# Patient Record
Sex: Female | Born: 1980 | Race: White | Hispanic: No | Marital: Married | Smoking: Never smoker
Health system: Southern US, Community
[De-identification: ages and names within clinical notes are randomized; demographics above are authoritative.]

## PROBLEM LIST (undated history)

## (undated) ENCOUNTER — Inpatient Hospital Stay (HOSPITAL_COMMUNITY): Payer: Self-pay

## (undated) DIAGNOSIS — F419 Anxiety disorder, unspecified: Secondary | ICD-10-CM

## (undated) DIAGNOSIS — R031 Nonspecific low blood-pressure reading: Secondary | ICD-10-CM

## (undated) DIAGNOSIS — D649 Anemia, unspecified: Secondary | ICD-10-CM

## (undated) DIAGNOSIS — Z8489 Family history of other specified conditions: Secondary | ICD-10-CM

## (undated) DIAGNOSIS — N96 Recurrent pregnancy loss: Secondary | ICD-10-CM

## (undated) DIAGNOSIS — T7840XA Allergy, unspecified, initial encounter: Secondary | ICD-10-CM

## (undated) DIAGNOSIS — Z8619 Personal history of other infectious and parasitic diseases: Secondary | ICD-10-CM

## (undated) DIAGNOSIS — D689 Coagulation defect, unspecified: Secondary | ICD-10-CM

## (undated) DIAGNOSIS — K589 Irritable bowel syndrome without diarrhea: Secondary | ICD-10-CM

## (undated) DIAGNOSIS — O09529 Supervision of elderly multigravida, unspecified trimester: Secondary | ICD-10-CM

## (undated) DIAGNOSIS — Z1589 Genetic susceptibility to other disease: Secondary | ICD-10-CM

## (undated) DIAGNOSIS — K219 Gastro-esophageal reflux disease without esophagitis: Secondary | ICD-10-CM

## (undated) HISTORY — PX: WISDOM TOOTH EXTRACTION: SHX21

## (undated) HISTORY — DX: Coagulation defect, unspecified: D68.9

## (undated) HISTORY — PX: HYMENECTOMY: SHX987

## (undated) HISTORY — DX: Allergy, unspecified, initial encounter: T78.40XA

## (undated) HISTORY — DX: Irritable bowel syndrome, unspecified: K58.9

## (undated) HISTORY — DX: Anxiety disorder, unspecified: F41.9

## (undated) HISTORY — DX: Genetic susceptibility to other disease: Z15.89

## (undated) HISTORY — DX: Personal history of other infectious and parasitic diseases: Z86.19

## (undated) HISTORY — PX: LAPAROSCOPY: SHX197

## (undated) HISTORY — DX: Supervision of elderly multigravida, unspecified trimester: O09.529

## (undated) HISTORY — PX: DILATION AND EVACUATION: SHX1459

---

## 1999-10-15 ENCOUNTER — Other Ambulatory Visit: Admission: RE | Admit: 1999-10-15 | Discharge: 1999-10-15 | Payer: Self-pay | Admitting: Obstetrics and Gynecology

## 2002-07-19 ENCOUNTER — Other Ambulatory Visit: Admission: RE | Admit: 2002-07-19 | Discharge: 2002-07-19 | Payer: Self-pay | Admitting: Obstetrics & Gynecology

## 2002-09-21 ENCOUNTER — Ambulatory Visit (HOSPITAL_COMMUNITY): Admission: RE | Admit: 2002-09-21 | Discharge: 2002-09-21 | Payer: Self-pay | Admitting: Obstetrics and Gynecology

## 2002-09-21 ENCOUNTER — Encounter (INDEPENDENT_AMBULATORY_CARE_PROVIDER_SITE_OTHER): Payer: Self-pay

## 2002-10-18 ENCOUNTER — Emergency Department (HOSPITAL_COMMUNITY): Admission: EM | Admit: 2002-10-18 | Discharge: 2002-10-18 | Payer: Self-pay | Admitting: Emergency Medicine

## 2010-02-12 ENCOUNTER — Ambulatory Visit (HOSPITAL_COMMUNITY)
Admission: RE | Admit: 2010-02-12 | Discharge: 2010-02-12 | Payer: Self-pay | Source: Home / Self Care | Attending: Obstetrics & Gynecology | Admitting: Obstetrics & Gynecology

## 2010-03-14 ENCOUNTER — Ambulatory Visit (HOSPITAL_COMMUNITY)
Admission: RE | Admit: 2010-03-14 | Discharge: 2010-03-14 | Payer: Self-pay | Source: Home / Self Care | Attending: Gynecology | Admitting: Gynecology

## 2010-07-19 NOTE — Op Note (Signed)
   NAME:  Cheryl Stein, Cheryl Stein                          ACCOUNT NO.:  [id number]   MEDICAL RECORD NO.:  [id number]                   PATIENT TYPE:  AMB   LOCATION:  [facility]C                                  FACILITY:  [facility]   PHYSICIAN:  Sherry A. Rosalio Macadamia, M.D.           DATE OF BIRTH:  10/14/1980   DATE OF PROCEDURE:  09/21/2002  DATE OF DISCHARGE:                                 OPERATIVE REPORT   PREOPERATIVE DIAGNOSIS:  Imperforate hymen.   POSTOPERATIVE DIAGNOSES:  1. Imperforate hymen.  2. Vulvar mole.   PROCEDURES:  1. Hymenectomy.  2. Vulvar mole excision.  3. Examination under anesthesia.   SURGEON:  Sherry A. Rosalio Macadamia, M.D.   ANESTHESIA:  General.   INDICATIONS:  This is a 29 year old G0, P0, woman, who has never been able  to use tampons and pelvic exam was not able to be performed because of a  partial imperforate hymen.  Because of that the patient is brought to the  operating room for excision.   FINDINGS:  Partial imperforate hymen, right vulvar mole.  Normal anteflexed  uterus.  No adnexal mass.   PROCEDURE:  The patient was brought into the operating room and given  adequate general anesthesia.  She was placed in dorsal lithotomy position.  Her perineum was washed with Hibiclens.  Inside of the vagina was washed  with Hibiclens with Q-Tips.  The hymen was infiltrated with 0.25% Marcaine.  Using hemostats placed x2, tissue between was cut, 4-0 chromic stitches were  taken.  This was done circumferentially around the hymenal tissues until two  fingers could easily be inserted into the vagina.  Adequate hemostasis was  present.  A small right vulvar nodule was seen, and this was excised as a  shave biopsy.  There was no significant bleeding present.  Exam under  anesthesia was then performed, the surgeon's gloves were changed, and the  bladder was in-and-out catheterized.  The patient was taken out of the  dorsal lithotomy position.  She was awakened, she was  extubated.  She was  moved from the operating table to a stretcher in stable condition.   COMPLICATIONS:  None.    ESTIMATED BLOOD LOSS:  Less than 5 mL.   OPERATIVE SPECIMENS:  Right vulvar mole.                                               Sherry A. Rosalio Macadamia, M.D.    SAD/MEDQ  D:  09/21/2002  T:  09/21/2002  Job:  [id number]

## 2010-10-02 ENCOUNTER — Inpatient Hospital Stay (HOSPITAL_COMMUNITY): Admission: AD | Admit: 2010-10-02 | Payer: Self-pay | Source: Ambulatory Visit | Admitting: Obstetrics and Gynecology

## 2010-12-21 ENCOUNTER — Inpatient Hospital Stay (HOSPITAL_COMMUNITY)
Admission: AD | Admit: 2010-12-21 | Discharge: 2010-12-24 | DRG: 371 | Disposition: A | Discharge status: Home / Self Care | Payer: BC Managed Care – PPO | Source: Ambulatory Visit | Attending: Obstetrics and Gynecology | Admitting: Obstetrics and Gynecology

## 2010-12-21 ENCOUNTER — Other Ambulatory Visit: Payer: Self-pay | Admitting: Obstetrics and Gynecology

## 2010-12-21 ENCOUNTER — Inpatient Hospital Stay (HOSPITAL_COMMUNITY): Payer: BC Managed Care – PPO | Admitting: Anesthesiology

## 2010-12-21 ENCOUNTER — Encounter (HOSPITAL_COMMUNITY)
Admission: AD | Disposition: A | Discharge status: Home / Self Care | Payer: Self-pay | Source: Ambulatory Visit | Attending: Obstetrics and Gynecology

## 2010-12-21 ENCOUNTER — Encounter (HOSPITAL_COMMUNITY): Payer: Self-pay

## 2010-12-21 ENCOUNTER — Inpatient Hospital Stay (HOSPITAL_COMMUNITY): Payer: BC Managed Care – PPO

## 2010-12-21 ENCOUNTER — Encounter (HOSPITAL_COMMUNITY): Payer: Self-pay | Admitting: Anesthesiology

## 2010-12-21 DIAGNOSIS — O368990 Maternal care for other specified fetal problems, unspecified trimester, not applicable or unspecified: Secondary | ICD-10-CM

## 2010-12-21 DIAGNOSIS — O368190 Decreased fetal movements, unspecified trimester, not applicable or unspecified: Secondary | ICD-10-CM | POA: Diagnosis present

## 2010-12-21 HISTORY — DX: Anemia, unspecified: D64.9

## 2010-12-21 LAB — COMPREHENSIVE METABOLIC PANEL
Alkaline Phosphatase: 141 U/L — ABNORMAL HIGH (ref 39–117)
BUN: 10 mg/dL (ref 6–23)
CO2: 23 mEq/L (ref 19–32)
Chloride: 102 mEq/L (ref 96–112)
GFR calc Af Amer: >90 mL/min (ref >90)
Glucose, Bld: 78 mg/dL (ref 70–99)
Potassium: 3.7 mEq/L (ref 3.5–5.1)
Total Bilirubin: 0.2 mg/dL — ABNORMAL LOW (ref 0.3–1.2)

## 2010-12-21 LAB — RPR: RPR Ser Ql: NONREACTIVE

## 2010-12-21 LAB — CBC
HCT: 34.8 % — ABNORMAL LOW (ref 36.0–46.0)
HCT: 30.2 % — ABNORMAL LOW (ref 36.0–46.0)
Hemoglobin: 9.9 g/dL — ABNORMAL LOW (ref 12.0–15.0)
MCH: 27.2 pg (ref 26.0–34.0)
MCHC: 32.5 g/dL (ref 30.0–36.0)
MCHC: 32.8 g/dL (ref 30.0–36.0)
Platelets: 193 10*3/uL (ref 150–400)
RDW: 13 % (ref 11.5–15.5)
WBC: 10 10*3/uL (ref 4.0–10.5)

## 2010-12-21 LAB — URINALYSIS, ROUTINE W REFLEX MICROSCOPIC
Bilirubin Urine: NEGATIVE
Hgb urine dipstick: NEGATIVE
Nitrite: NEGATIVE
Specific Gravity, Urine: 1.015 (ref 1.005–1.030)

## 2010-12-21 LAB — URINE MICROSCOPIC-ADD ON

## 2010-12-21 LAB — URIC ACID: Uric Acid, Serum: 4.6 mg/dL (ref 2.4–7.0)

## 2010-12-21 SURGERY — Surgical Case
Anesthesia: Regional | Wound class: Clean Contaminated

## 2010-12-21 MED ORDER — LACTATED RINGERS IV SOLN
INTRAVENOUS | Status: DC
  Administered 2010-12-21 – 2010-12-22 (×2): via INTRAVENOUS

## 2010-12-21 MED ORDER — SODIUM CHLORIDE 0.9 % IV SOLN: 1.0000 ug/kg/h | INTRAVENOUS | Status: DC | PRN

## 2010-12-21 MED ORDER — ONDANSETRON HCL 4 MG/2ML IJ SOLN
INTRAMUSCULAR | Status: DC | PRN
  Administered 2010-12-21: 4 mg via INTRAVENOUS

## 2010-12-21 MED ORDER — OXYCODONE-ACETAMINOPHEN 5-325 MG PO TABS
1.0000 | ORAL_TABLET | ORAL | Status: DC | PRN
  Administered 2010-12-22 – 2010-12-23 (×3): 1 via ORAL
  Filled 2010-12-21 (×3): qty 1

## 2010-12-21 MED ORDER — OXYTOCIN 20 UNITS IN LACTATED RINGERS INFUSION - SIMPLE
INTRAVENOUS | Status: DC | PRN
  Administered 2010-12-21: 10 [IU] via INTRAVENOUS
  Administered 2010-12-21 (×2): 20 [IU] via INTRAVENOUS

## 2010-12-21 MED ORDER — FAMOTIDINE IN NACL 20-0.9 MG/50ML-% IV SOLN
INTRAVENOUS | Status: AC
  Administered 2010-12-21: 20 mg
  Filled 2010-12-21: qty 50

## 2010-12-21 MED ORDER — CITRIC ACID-SODIUM CITRATE 334-500 MG/5ML PO SOLN
30.0000 mL | Freq: Once | ORAL | Status: AC
  Administered 2010-12-21: 30 mL via ORAL

## 2010-12-21 MED ORDER — WITCH HAZEL-GLYCERIN EX PADS: 1.0000 "application " | MEDICATED_PAD | CUTANEOUS | Status: DC | PRN

## 2010-12-21 MED ORDER — NALOXONE HCL 0.4 MG/ML IJ SOLN: 0.4000 mg | INTRAMUSCULAR | Status: DC | PRN

## 2010-12-21 MED ORDER — MEPERIDINE HCL 25 MG/ML IJ SOLN: 6.2500 mg | INTRAMUSCULAR | Status: DC | PRN

## 2010-12-21 MED ORDER — SCOPOLAMINE 1 MG/3DAYS TD PT72
1.0000 | MEDICATED_PATCH | Freq: Once | TRANSDERMAL | Status: DC
  Administered 2010-12-21: 1.5 mg via TRANSDERMAL

## 2010-12-21 MED ORDER — CITRIC ACID-SODIUM CITRATE 334-500 MG/5ML PO SOLN
ORAL | Status: AC
  Administered 2010-12-21: 30 mL via ORAL
  Filled 2010-12-21: qty 15

## 2010-12-21 MED ORDER — MENTHOL 3 MG MT LOZG: 1.0000 | LOZENGE | OROMUCOSAL | Status: DC | PRN

## 2010-12-21 MED ORDER — TETANUS-DIPHTH-ACELL PERTUSSIS 5-2.5-18.5 LF-MCG/0.5 IM SUSP: 0.5000 mL | Freq: Once | INTRAMUSCULAR | Status: DC

## 2010-12-21 MED ORDER — ONDANSETRON HCL 4 MG PO TABS: 4.0000 mg | ORAL_TABLET | ORAL | Status: DC | PRN

## 2010-12-21 MED ORDER — PRENATAL PLUS 27-1 MG PO TABS
1.0000 | ORAL_TABLET | Freq: Every day | ORAL | Status: DC
  Administered 2010-12-22 – 2010-12-24 (×2): 1 via ORAL
  Filled 2010-12-21 (×3): qty 1

## 2010-12-21 MED ORDER — SODIUM CHLORIDE 0.9 % IJ SOLN: 3.0000 mL | INTRAMUSCULAR | Status: DC | PRN

## 2010-12-21 MED ORDER — DIPHENHYDRAMINE HCL 50 MG/ML IJ SOLN: 12.5000 mg | INTRAMUSCULAR | Status: DC | PRN

## 2010-12-21 MED ORDER — OXYTOCIN 20 UNITS IN LACTATED RINGERS INFUSION - SIMPLE: 125.0000 mL/h | INTRAVENOUS | Status: AC

## 2010-12-21 MED ORDER — NALBUPHINE HCL 10 MG/ML IJ SOLN: 5.0000 mg | INTRAMUSCULAR | Status: DC | PRN

## 2010-12-21 MED ORDER — SENNOSIDES-DOCUSATE SODIUM 8.6-50 MG PO TABS
2.0000 | ORAL_TABLET | Freq: Every day | ORAL | Status: DC
  Administered 2010-12-21 – 2010-12-23 (×3): 2 via ORAL

## 2010-12-21 MED ORDER — SCOPOLAMINE 1 MG/3DAYS TD PT72
MEDICATED_PATCH | TRANSDERMAL | Status: AC
  Filled 2010-12-21: qty 1

## 2010-12-21 MED ORDER — LACTATED RINGERS IV SOLN
INTRAVENOUS | Status: DC | PRN
  Administered 2010-12-21 (×3): via INTRAVENOUS

## 2010-12-21 MED ORDER — ONDANSETRON HCL 4 MG/2ML IJ SOLN: 4.0000 mg | Freq: Three times a day (TID) | INTRAMUSCULAR | Status: DC | PRN

## 2010-12-21 MED ORDER — SIMETHICONE 80 MG PO CHEW: 80.0000 mg | CHEWABLE_TABLET | ORAL | Status: DC | PRN

## 2010-12-21 MED ORDER — IBUPROFEN 600 MG PO TABS
600.0000 mg | ORAL_TABLET | Freq: Four times a day (QID) | ORAL | Status: DC | PRN
  Administered 2010-12-22: 600 mg via ORAL
  Filled 2010-12-21 (×7): qty 1

## 2010-12-21 MED ORDER — LANOLIN HYDROUS EX OINT: 1.0000 "application " | TOPICAL_OINTMENT | CUTANEOUS | Status: DC | PRN

## 2010-12-21 MED ORDER — KETOROLAC TROMETHAMINE 60 MG/2ML IM SOLN
INTRAMUSCULAR | Status: AC
  Administered 2010-12-21: 60 mg via INTRAMUSCULAR
  Filled 2010-12-21: qty 2

## 2010-12-21 MED ORDER — ZOLPIDEM TARTRATE 5 MG PO TABS: 5.0000 mg | ORAL_TABLET | Freq: Every evening | ORAL | Status: DC | PRN

## 2010-12-21 MED ORDER — DIBUCAINE 1 % RE OINT: 1.0000 "application " | TOPICAL_OINTMENT | RECTAL | Status: DC | PRN

## 2010-12-21 MED ORDER — KETOROLAC TROMETHAMINE 60 MG/2ML IM SOLN
60.0000 mg | Freq: Once | INTRAMUSCULAR | Status: AC | PRN
  Administered 2010-12-21: 60 mg via INTRAMUSCULAR

## 2010-12-21 MED ORDER — IBUPROFEN 600 MG PO TABS
600.0000 mg | ORAL_TABLET | Freq: Four times a day (QID) | ORAL | Status: DC
  Administered 2010-12-21 – 2010-12-24 (×9): 600 mg via ORAL
  Filled 2010-12-21 (×2): qty 1

## 2010-12-21 MED ORDER — KETOROLAC TROMETHAMINE 30 MG/ML IJ SOLN: 30.0000 mg | Freq: Four times a day (QID) | INTRAMUSCULAR | Status: AC | PRN

## 2010-12-21 MED ORDER — ONDANSETRON HCL 4 MG/2ML IJ SOLN: 4.0000 mg | INTRAMUSCULAR | Status: DC | PRN

## 2010-12-21 MED ORDER — METOCLOPRAMIDE HCL 5 MG/ML IJ SOLN: 10.0000 mg | Freq: Three times a day (TID) | INTRAMUSCULAR | Status: DC | PRN

## 2010-12-21 MED ORDER — DIPHENHYDRAMINE HCL 50 MG/ML IJ SOLN: 25.0000 mg | INTRAMUSCULAR | Status: DC | PRN

## 2010-12-21 MED ORDER — CEFAZOLIN SODIUM 1-5 GM-% IV SOLN
1.0000 g | INTRAVENOUS | Status: AC
  Administered 2010-12-21: 1 g via INTRAVENOUS
  Filled 2010-12-21: qty 50

## 2010-12-21 MED ORDER — FAMOTIDINE IN NACL 20-0.9 MG/50ML-% IV SOLN: 20.0000 mg | Freq: Once | INTRAVENOUS | Status: DC

## 2010-12-21 MED ORDER — SIMETHICONE 80 MG PO CHEW
80.0000 mg | CHEWABLE_TABLET | Freq: Three times a day (TID) | ORAL | Status: DC
  Administered 2010-12-21 – 2010-12-24 (×6): 80 mg via ORAL

## 2010-12-21 MED ORDER — DIPHENHYDRAMINE HCL 25 MG PO CAPS: 25.0000 mg | ORAL_CAPSULE | ORAL | Status: DC | PRN

## 2010-12-21 MED ORDER — DIPHENHYDRAMINE HCL 25 MG PO CAPS: 25.0000 mg | ORAL_CAPSULE | Freq: Four times a day (QID) | ORAL | Status: DC | PRN

## 2010-12-21 SURGICAL SUPPLY — 29 items
CLOTH BEACON ORANGE TIMEOUT ST (SAFETY) ×2 IMPLANT
DRESSING TELFA 8X3 (GAUZE/BANDAGES/DRESSINGS) IMPLANT
DRSG COVADERM 4X8 (GAUZE/BANDAGES/DRESSINGS) ×2 IMPLANT
ELECT REM PT RETURN 9FT ADLT (ELECTROSURGICAL) ×2
ELECTRODE REM PT RTRN 9FT ADLT (ELECTROSURGICAL) ×1 IMPLANT
EXTRACTOR VACUUM M CUP 4 TUBE (SUCTIONS) IMPLANT
GAUZE SPONGE 4X4 12PLY STRL LF (GAUZE/BANDAGES/DRESSINGS) IMPLANT
GLOVE BIO SURGEON STRL SZ8 (GLOVE) ×2 IMPLANT
GLOVE SS BIOGEL STRL SZ 8 (GLOVE) ×2 IMPLANT
GLOVE SUPERSENSE BIOGEL SZ 8 (GLOVE) ×2
GLOVE SURG ORTHO 8.0 STRL STRW (GLOVE) IMPLANT
GOWN PREVENTION PLUS LG XLONG (DISPOSABLE) ×4 IMPLANT
GOWN STRL REIN XL XLG (GOWN DISPOSABLE) ×2 IMPLANT
KIT ABG SYR 3ML LUER SLIP (SYRINGE) ×2 IMPLANT
NEEDLE HYPO 25X5/8 SAFETYGLIDE (NEEDLE) ×2 IMPLANT
NS IRRIG 1000ML POUR BTL (IV SOLUTION) ×2 IMPLANT
PACK C SECTION WH (CUSTOM PROCEDURE TRAY) ×2 IMPLANT
PAD ABD 7.5X8 STRL (GAUZE/BANDAGES/DRESSINGS) IMPLANT
SLEEVE SCD COMPRESS KNEE MED (MISCELLANEOUS) ×2 IMPLANT
STAPLER VISISTAT 35W (STAPLE) IMPLANT
SUT MNCRL 0 VIOLET CTX 36 (SUTURE) ×3 IMPLANT
SUT MONOCRYL 0 CTX 36 (SUTURE) ×3
SUT PDS AB 1 CT  36 (SUTURE)
SUT PDS AB 1 CT 36 (SUTURE) IMPLANT
SUT VIC AB 1 CTX 36 (SUTURE) ×1
SUT VIC AB 1 CTX36XBRD ANBCTRL (SUTURE) ×1 IMPLANT
TOWEL OR 17X24 6PK STRL BLUE (TOWEL DISPOSABLE) ×4 IMPLANT
TRAY FOLEY CATH 14FR (SET/KITS/TRAYS/PACK) ×2 IMPLANT
WATER STERILE IRR 1000ML POUR (IV SOLUTION) ×2 IMPLANT

## 2010-12-21 NOTE — Progress Notes (Signed)
Decreased fetal movement today has tried drinking eating nothing has helped.

## 2010-12-21 NOTE — Anesthesia Preprocedure Evaluation (Signed)
Anesthesia Evaluation  Name, MR# and DOB Patient awake  General Assessment Comment  Reviewed: Allergy & Precautions, H&P , Patient's Chart, lab work & pertinent test results  Airway Mallampati: II TM Distance: >3 FB Neck ROM: full    Dental No notable dental hx.    Pulmonary  clear to auscultation  Pulmonary exam normal       Cardiovascular Exercise Tolerance: Good regular Normal    Neuro/Psych    GI/Hepatic   Endo/Other    Renal/GU      Musculoskeletal   Abdominal   Peds  Hematology   Anesthesia Other Findings   Reproductive/Obstetrics                           Anesthesia Physical Anesthesia Plan  ASA: II  Anesthesia Plan: Spinal   Post-op Pain Management:    Induction:   Airway Management Planned:   Additional Equipment:   Intra-op Plan:   Post-operative Plan:   Informed Consent: I have reviewed the patients History and Physical, chart, labs and discussed the procedure including the risks, benefits and alternatives for the proposed anesthesia with the patient or authorized representative who has indicated his/her understanding and acceptance.   Dental Advisory Given  Plan Discussed with: CRNA  Anesthesia Plan Comments: (Lab work confirmed with CRNA in room. Platelets okay. Discussed spinal anesthetic, and patient consents to the procedure:  included risk of possible headache,backache, failed block, allergic reaction, and nerve injury. This patient was asked if she had any questions or concerns before the procedure started. )        Anesthesia Quick Evaluation  

## 2010-12-21 NOTE — Op Note (Signed)
Dictation [phone] [id number]

## 2010-12-21 NOTE — Consult Note (Signed)
Neonatology Note:   Attendance at C-section:    I was asked to attend this primary C/S at 37 3/7 weeks due to decreased fetal movement, BPP of 2/8, and NRFHR. The mother is a G1P0. ROM at delivery, fluid with thin meconium. Infant vigorous with good spontaneous cry and tone. Needed only bulb suctioning, got thin green secretions several times. Ap 9/9. Lungs clear to ausc in DR. To CN to care of Pediatrician.   Deatra James, MD

## 2010-12-21 NOTE — Transfer of Care (Signed)
Immediate Anesthesia Transfer of Care Note  Patient: Cheryl Stein  Procedure(s) Performed:  CESAREAN SECTION - Primary Cesarean Section with birth of baby boy @ 1509  Patient Location: PACU  Anesthesia Type: Spinal  Level of Consciousness: awake, alert  and oriented  Airway & Oxygen Therapy: Patient Spontanous Breathing  Post-op Assessment: Report given to PACU RN and Post -op Vital signs reviewed and stable  Post vital signs: Reviewed and stable  Complications: No apparent anesthesia complications

## 2010-12-21 NOTE — Op Note (Signed)
Cheryl Stein, Cheryl Stein           ACCOUNT NO.:  [id number]  MEDICAL RECORD NO.:  [id number]  LOCATION:  [facility]                          FACILITY:  [facility]  PHYSICIAN:  Dineen Kid. Rana Snare, M.D.    DATE OF BIRTH:  [birth year]  DATE OF PROCEDURE:  12/21/2010 DATE OF DISCHARGE:                              OPERATIVE REPORT   PREOPERATIVE DIAGNOSIS:  Intrauterine pregnancy at 48 and 3/7 weeks, fetal distress, decreased fetal movement.  POSTOPERATIVE DIAGNOSIS:  Intrauterine pregnancy at 37 and 3/7 weeks, fetal distress, decreased fetal movement.  PROCEDURE:  Primary low segment transverse cesarean section.  SURGEON:  Dineen Kid. Rana Snare, MD  ANESTHESIA:  Spinal.  INDICATIONS:  Cheryl Stein is a 30 year old, G1, who presented to the hospital for decreased fetal movement  today. She reports that the baby is normally very active and she could not even move today despite giving fluids, rest, caffeine, presented to the hospital, had a very flat fetal heart rate tracing, underwent biophysical profile, which showed a biophysical of 2/8.  The only issue for fluid which was also on the low side as well.  Placed back on the fetal monitor and was having contractions every 10-15 minutes, each1 followed by a late deceleration and bladder reactivity because of fetal distress and nonreassuring biophysical profile.  We will proceed with an urgent cesarean section. Risks and benefits were discussed at length.  Informed consent was obtained.  FINDINGS AT TIME OF SURGERY:  Viable female infant, Apgars were 9 and 9. PH arterial 7.31.  Fluid was clear with greenish tint. The placenta appeared to be normal except for marginal insertion of the placenta.  DESCRIPTION OF PROCEDURE:  After adequate analgesia, the patient was placed in the supine position left lateral tilt.  She was sterilely prepped and draped.  Bladder sterilely drained with Foley catheter.  A Pfannenstiel skin incision was made 2 fingerbreadths above  the pubic symphysis, taken down sharply to the fascia which was incised transversely, extended superiorly and inferiorly off the bellies rectus muscles were separated sharply in the midline.  Peritoneum entered sharply.  Bladder flap created, placed the bladder blade.  Low segment myotomy incision was made down to the amniotic sac.  Amniotomy was performed.  Infant was vertex, delivered atraumatically, the nares and pharynx suctioned, infant delivered, cord clamped and cut, handed to pediatrician for resuscitation.  Cord blood was then obtained.  Placenta extracted manually.  Uterus exteriorized, wiped clean with a dry lap. The myotomy incision closed in 2 layers, 1st being running locking, 2nd being imbricating layer of 0 Monocryl suture.  The right side of the myotomy incision had small amount of bleeding near the edge of the bladder, figure-of-eight of 0 Monocryl suture was used to achieve good hemostasis and a small area of the dome of the bladder was oversewn with a 2-0 chromic due to potential weakening of the areas on the bladder. The bladder was then instilled with sterile milk and was 3-400 mL without any evidence leaking.  The bladder was then drained.  Uterus placed back into the abdominal cavity.  After copious irrigation and adequate hemostasis was assured, the peritoneum was closed with 0 Monocryl suture.  Rectus muscle plicated in midline.  Irrigation applied after adequate hemostasis, the fascia closed with 0 Vicryl in a running fashion.  Irrigation applied after adequate hemostasis, skin stapled, Steri-Strips applied.  The patient tolerated procedure well and was stable on transfer to recovery room.  Sponge and instrument was normal x3.  ESTIMATED BLOOD LOSS:  700 mL.  The patient received 1 g of Ancef preoperatively.     Dineen Kid Rana Snare, M.D.     DCL/MEDQ  D:  12/21/2010  T:  12/21/2010  Job:  [id number]

## 2010-12-21 NOTE — ED Provider Notes (Signed)
I have reviewed these findings and discussed the plan of care with the patient.  For fetal distress now with late decels and BPP 8/8 plan primary Cesarean section.  The risks and benefits were discussed and informed consent obtained.  NICU and Anesthesia notified. DL

## 2010-12-21 NOTE — Anesthesia Postprocedure Evaluation (Signed)
  Anesthesia Post-op Note  Patient: Cheryl Stein  Procedure(s) Performed:  CESAREAN SECTION - Primary Cesarean Section with birth of baby boy @ 1509  Patient is awake, responsive, moving her legs, and has signs of resolution of her numbness. Pain and nausea are reasonably well controlled. Vital signs are stable and clinically acceptable. Oxygen saturation is clinically acceptable. There are no apparent anesthetic complications at this time. Patient is ready for discharge.

## 2010-12-21 NOTE — Progress Notes (Signed)
Pt presents to MAU with complaints of decreased fetal movement.

## 2010-12-21 NOTE — ED Notes (Signed)
Pt back from [place]. Verbal report was 2/8 for BPP, Laurell Josephs charge RN called Dr. Rana Snare, Dr. Christy Gentles CNM notified.

## 2010-12-21 NOTE — ED Notes (Signed)
Pt up to bathroom, lab at bedside for blood work. Following lab draw patient will go to [place]. [place] ready for patient now.

## 2010-12-21 NOTE — ED Provider Notes (Signed)
Reina Fuse y.o.G1P0000 @[redacted]w[redacted]d  Chief Complaint  Patient presents with  . Decreased Fetal Movement    SUBJECTIVE  HPI: Presents with history of perceiving only 2 slight fetal movements all day today. Her fetus is usually very active during the day. She is unaware of any contractions. Denies abdominal discomfort, leakage of fluid, vaginal bleeding. Denies headache, visual disturbance, epigastric pain.  Pregnancy course: Significant for a marginal cord insertion. States glucola and BP nl at PN visits.  Past Medical History  Diagnosis Date  . Anemia   . Endometriosis    Ob Hx: Gyn Hx: Past Surgical History  Procedure Date  . Laparoscopy    History   Social History  . Marital Status: Married    Spouse Name: N/A    Number of Children: N/A  . Years of Education: N/A   Occupational History  . Not on file.   Social History Main Topics  . Smoking status: Never Smoker   . Smokeless tobacco: Not on file  . Alcohol Use: No  . Drug Use: No  . Sexually Active:    Other Topics Concern  . Not on file   Social History Narrative  . No narrative on file   No current facility-administered medications on file prior to encounter.   No current outpatient prescriptions on file prior to encounter.   Not on File  ROS: Pertinent items in HPI  OBJECTIVE  BP 127/87  Pulse 83  Temp(Src) 98.5 F (36.9 C) (Oral)  Resp 16  Ht 5' 7.5" (1.715 m)  Wt 79.198 kg (174 lb 9.6 oz)  BMI 26.94 kg/m2  Rpt BP 140/88  Physical Exam:  General: WN/WD in NAD Abd: soft nontender. EFW 5#, FH 33cm Pelvic: cx post 1/ 2.5 cm/-2 vtx Back:neg CVAT Ext:1+ edema, 2+ DTRs Results for orders placed during the hospital encounter of 12/21/10 (from the past 24 hour(s))  URINALYSIS, ROUTINE W REFLEX MICROSCOPIC     Status: Abnormal   Collection Time   12/21/10  1:15 PM      Component Value Range   Color, Urine YELLOW  YELLOW    Appearance CLEAR  CLEAR    Specific Gravity, Urine 1.015  1.005 -  1.030    pH 6.0  5.0 - 8.0    Glucose, UA NEGATIVE  NEGATIVE (mg/dL)   Hgb urine dipstick NEGATIVE  NEGATIVE    Bilirubin Urine NEGATIVE  NEGATIVE    Ketones, ur NEGATIVE  NEGATIVE (mg/dL)   Protein, ur NEGATIVE  NEGATIVE (mg/dL)   Urobilinogen, UA 0.2  0.0 - 1.0 (mg/dL)   Nitrite NEGATIVE  NEGATIVE    Leukocytes, UA MODERATE (*) NEGATIVE   URINE MICROSCOPIC-ADD ON     Status: Abnormal   Collection Time   12/21/10  1:15 PM      Component Value Range   Squamous Epithelial / LPF FEW (*) RARE    WBC, UA 7-10  <3 (WBC/hpf)   RBC / HPF 3-6  <3 (RBC/hpf)   Bacteria, UA FEW (*) RARE   CBC     Status: Abnormal   Collection Time   12/21/10  1:16 PM      Component Value Range   WBC 10.0  4.0 - 10.5 (K/uL)   RBC 4.15  3.87 - 5.11 (MIL/uL)   Hemoglobin 11.3 (*) 12.0 - 15.0 (g/dL)   HCT 11.9 (*) 14.7 - 46.0 (%)   MCV 83.9  78.0 - 100.0 (fL)   MCH 27.2  26.0 - 34.0 (pg)  MCHC 32.5  30.0 - 36.0 (g/dL)   RDW 45.4  09.8 - 11.9 (%)   Platelets 193  150 - 400 (K/uL)  URIC ACID     Status: Normal   Collection Time   12/21/10  1:16 PM      Component Value Range   Uric Acid, Serum 4.6  2.4 - 7.0 (mg/dL)   BPP 2/8  Toco: occ UC FHR 160-165 baseline, NR, mod variability, variable  To 110 with initial UC, retrun to 165 baselline ASSESSMENT  G1 at 37.4 wks with NRFHR and BPP Borderline BP elevations Marginal cord insertion Small for dates by exam  PLAN NPO, O2 by mask, IVF  C/W Dr. Rana Snare

## 2010-12-21 NOTE — Anesthesia Procedure Notes (Addendum)
Spinal Block  Patient location during procedure: OR Preanesthetic Checklist Completed: patient identified, site marked, surgical consent, pre-op evaluation, timeout performed, IV checked, risks and benefits discussed and monitors and equipment checked Spinal Block Patient position: sitting Prep: DuraPrep Patient monitoring: heart rate, cardiac monitor, continuous pulse ox and blood pressure Approach: midline Location: L3-4 Injection technique: single-shot Needle Needle type: Sprotte  Needle gauge: 24 G Needle length: 9 cm Assessment Sensory level: T4 Additional Notes Spinal Dosage in OR  Bupivicaine ml       1.7 PFMS04   mcg        150 Fentanyl mcg            20

## 2010-12-22 LAB — CBC
MCH: 27.6 pg (ref 26.0–34.0)
Platelets: 164 10*3/uL (ref 150–400)
RBC: 3.15 MIL/uL — ABNORMAL LOW (ref 3.87–5.11)

## 2010-12-22 NOTE — Progress Notes (Addendum)
Subjective: Postpartum Day 1: Cesarean Delivery Patient reports tolerating PO.    Objective:   Physical Exam:  General: alert, cooperative and no distress Lochia: appropriate Uterine Fundus: firm Incision: healing well, no significant drainage DVT Evaluation: No evidence of DVT seen on physical exam.      Assessment/Plan: Status post Cesarean section. Doing well postoperatively.  Continue current care.  Nedim Oki C 09/14/2010, 10:23 AM

## 2010-12-23 ENCOUNTER — Encounter (HOSPITAL_COMMUNITY): Payer: Self-pay | Admitting: Obstetrics and Gynecology

## 2010-12-23 MED ORDER — PRENATAL PLUS 27-1 MG PO TABS: 1.0000 | ORAL_TABLET | Freq: Every day | ORAL | Status: DC

## 2010-12-23 NOTE — Progress Notes (Signed)
Subjective: Postpartum Day 2: Cesarean Delivery Patient reports tolerating PO, + flatus and no problems voiding.    Objective: Vital signs in last 24 hours: Temp:  [97.9 F (36.6 C)-98.6 F (37 C)] 98.2 F (36.8 C) (10/22 0547) Pulse Rate:  [93-106] 93  (10/22 0547) Resp:  [16-18] 18  (10/22 0547) BP: (107-124)/(73-80) 107/73 mmHg (10/22 0547) SpO2:  [97 %] 97 % (10/21 1300)  Physical Exam:  General: alert and cooperative Lochia: appropriate Uterine Fundus: firm Incision: healing well, staples intact, small ecchymosis DVT Evaluation: No evidence of DVT seen on physical exam.   Basename 12/22/10 0532 12/21/10 1937  HGB 8.7* 9.9*  HCT 26.1* 30.2*    Assessment/Plan: Status post Cesarean section. Doing well postoperatively.  Continue current care.  Cheryl Stein G 12/23/2010, 8:05 AM

## 2010-12-24 ENCOUNTER — Encounter (HOSPITAL_COMMUNITY)
Admission: RE | Admit: 2010-12-24 | Discharge: 2010-12-24 | Disposition: A | Discharge status: Home / Self Care | Payer: BC Managed Care – PPO | Source: Ambulatory Visit | Attending: Obstetrics and Gynecology | Admitting: Obstetrics and Gynecology

## 2010-12-24 DIAGNOSIS — O923 Agalactia: Secondary | ICD-10-CM | POA: Insufficient documentation

## 2010-12-24 MED ORDER — IBUPROFEN 600 MG PO TABS: 600.0000 mg | ORAL_TABLET | Freq: Four times a day (QID) | ORAL | Status: AC | PRN

## 2010-12-24 MED ORDER — OXYCODONE-ACETAMINOPHEN 5-325 MG PO TABS: 1.0000 | ORAL_TABLET | ORAL | Status: AC | PRN

## 2010-12-24 NOTE — Discharge Summary (Signed)
Obstetric Discharge Summary Reason for Admission: observation/evaluation Prenatal Procedures: none Intrapartum Procedures: cesarean: low cervical, transverse Postpartum Procedures: none Complications-Operative and Postpartum: none Hemoglobin  Date Value Range Status  12/22/2010 8.7* 12.0-15.0 (g/dL) Final     HCT  Date Value Range Status  12/22/2010 26.1* 36.0-46.0 (%) Final    Discharge Diagnoses: Term Pregnancy-delivered  Discharge Information: Date: 12/24/2010 Activity: pelvic rest Diet: routine Medications: PNV, Ibuprofen and Percocet Condition: stable Instructions: refer to practice specific booklet Discharge to: home   Newborn Data: Live born female  Birth Weight: 5 lb 12.1 oz (2611 g) APGAR: 9, 9  Home with mother.  Tagen Milby G 12/24/2010, 8:22 AM

## 2010-12-24 NOTE — Progress Notes (Signed)
Subjective: Postpartum Day 3: Cesarean Delivery Patient reports tolerating PO, + flatus and no problems voiding.    Objective: Vital signs in last 24 hours: Temp:  [98 F (36.7 C)-98.2 F (36.8 C)] 98 F (36.7 C) (10/23 0626) Pulse Rate:  [84-121] 84  (10/23 0626) Resp:  [18] 18  (10/23 0626) BP: (103-129)/(71-75) 103/71 mmHg (10/23 6213)  Physical Exam:  General: alert and cooperative Lochia: appropriate Uterine Fundus: firm Incision: slight clear drainage present, staples left in place DVT Evaluation: No evidence of DVT seen on physical exam.   Basename 12/22/10 0532 12/21/10 1937  HGB 8.7* 9.9*  HCT 26.1* 30.2*    Assessment/Plan: Status post Cesarean section. Doing well postoperatively.  Discharge home with standard precautions and return to clinic in 2-3 days for staple removal.  Mylon Mabey G 12/24/2010, 8:13 AM

## 2010-12-26 ENCOUNTER — Ambulatory Visit (HOSPITAL_COMMUNITY)
Admission: RE | Admit: 2010-12-26 | Discharge: 2010-12-26 | Disposition: A | Discharge status: Home / Self Care | Payer: BC Managed Care – PPO | Source: Ambulatory Visit | Attending: Obstetrics and Gynecology | Admitting: Obstetrics and Gynecology

## 2010-12-26 NOTE — Progress Notes (Signed)
Infant Lactation Consultation Outpatient Visit Note  Patient Name: Nyjae Hodge Date of Birth: 08/25/1980  12/21/10 Birth Weight:  5-12 Gestational Age at Delivery: Gestational Age: <None> 37 3/7 weeks Type of Delivery: Cesarean Section  Breastfeeding History Frequency of Breastfeeding: every 1-3 hrs Length of Feeding: 10-15 mins Voids: 6-8 Stools: >8 yellow seedy  Supplementing / Method: Pumping:  Type of Pump: Symphony   Frequency: occasionally 10-69ml  Volume:  30 ml.  Comments:  Mom said that the first night home, baby had questionable wheezing, and difficulty breathing (meconium fluid at delivery) so was advised to go to ER.  He wasn't admitted as lungs were basically clear.  Mom and Dad still "nervous" about baby being here earlier than expected, and the delivery was an emergency cesarean for fetal distress)  Spent time listening to their story and offering support, reassurance, and praise for how well Jomarie Longs is doing.  Baby discharged at 7% weight loss (5-6) and has already gained over 2 oz on Mother's breast milk. Mom states baby was unable to latch very well last night, and was fussy.   Consultation Evaluation: Mahogony uses football hold, encouraged her to sit more upright, and placed pillow behind her back, with support for baby at nipple level.  Latrica latches Jomarie Longs very well, had her adjust her finger placement further back onto breast, and demonstrated how to uncurl lower lip by giving gentle chin tug.  Baby fed very well, no discomfort, and nipple not pinched post feed.  Baby does need skin to skin, and regular stimulation to feed nutritively. Initial Feeding Assessment: Pre-feed Weight:5-8.3 Post-feed Weight: 5-9.5 Amount Transferred: 1.2 oz Comments: left breast 15 mins (10 nutritive before becoming sleepy)   Additional Feeding Assessment: Changed diaper as baby was sleepy, and awakened him to latch onto right breast Pre-feed Weight:  2522 Post-feed Weight: 2538 Amount Transferred: 16 ml  Comments: Baby fed on right side in football hold, for 10 mins, Mom working hard to keep baby nutritive.  Baby came off on his own     Total Breast milk Transferred this Visit:  1.7 oz (50 ml) Total Supplement Given: 0  Plan 1. Breast feed every 2-3 hrs, good deep latch with swallowing heard 2. Goal is 10-20 mins each side with sucking and swallowing heard (changing diaper between breasts if Jomarie Longs sleepy) 3. During daytime-pump after breastfeeding 10 mins- both breasts - store milk 4. If Jomarie Longs doesn't feed on both breasts- dropper or slow flow bottle 15 ml EBM to [place] after breastfeeding 5.If Jomarie Longs won't latch and nurse well at the 3 hr. Mark, pump both breasts 15-20 mins and offer EBM by dropper or slow flow bottle 45-47ml.  Follow-Up   Out patient appointment Wednesday, October 31st at 2:30p    Judee Clara RN, [facility] 12/26/2010, 9:52 AM

## 2011-01-01 ENCOUNTER — Ambulatory Visit (HOSPITAL_COMMUNITY)
Admission: RE | Admit: 2011-01-01 | Discharge: 2011-01-01 | Disposition: A | Discharge status: Home / Self Care | Payer: BC Managed Care – PPO | Source: Ambulatory Visit | Attending: Obstetrics and Gynecology | Admitting: Obstetrics and Gynecology

## 2011-01-01 NOTE — Progress Notes (Addendum)
Infant Lactation Consultation Outpatient Visit Note  Patient Name: Cheryl Stein Date of Birth: 05/26/1980 Birth Weight:  5+12 Gestational Age at Delivery: 37 weeks Type of Delivery: C-section  Breastfeeding History Frequency of Breastfeeding: every 2-3 hours Length of Feeding: 15 per side Voids: 6  Stools: 6 +  Most are green (no curds) some are yellow with curds.  Encouraged to let Jomarie Longs feed on one breast until finished and then switch sides or put him back on the same side to get the hind milk.  Supplementing / Method: Pumping:  Type of Pump:  Symphony   Frequency: occasional use now  Volume:    Comments:    Consultation Evaluation:  Initial Feeding Assessment: Pre-feed [id number]:[id number] Post-feed ZHYQMV:7846 Amount Transferred:32 Comments:Encouraged mom to have Jomarie Longs pull more breast tissue in with the lower lip.  Additional Feeding Assessment: Pre-feed [id number]:8413 Post-feed KGMWNU:2725 Amount Transferred:6 Comments:Placed on second breast.  Sleepy.   Suckled briefly.  Additional Feeding Assessment: Pre-feed Weight: Post-feed Weight: Amount Transferred: Comments:  Total Breast milk Transferred this Visit: 38 ml  Jomarie Longs had eaten a "snack " 45 minutes prior to this latch.  Encouraged to feed on cue and at least 8 times in 24 hours but informed that he could eat 12-14 times in 24 hours.   Total Supplement Given: None  Additional Interventions:  Helped mom with minor positioning adjustments.   Follow-Up  Pediatrician appointment scheduled for Friday Nov 9th.  Recommended weight by [employer] on Friday.  Encouraged attendance at BF support group on Tuesday.      Soyla Dryer 01/01/2011, 2:55 PM

## 2012-03-01 ENCOUNTER — Other Ambulatory Visit (HOSPITAL_COMMUNITY): Payer: Self-pay | Admitting: Obstetrics and Gynecology

## 2012-03-01 DIAGNOSIS — N979 Female infertility, unspecified: Secondary | ICD-10-CM

## 2012-03-08 ENCOUNTER — Ambulatory Visit (HOSPITAL_COMMUNITY): Admission: RE | Admit: 2012-03-08 | Payer: 59 | Source: Ambulatory Visit

## 2012-03-09 ENCOUNTER — Ambulatory Visit (HOSPITAL_COMMUNITY)
Admission: RE | Admit: 2012-03-09 | Discharge: 2012-03-09 | Disposition: A | Discharge status: Home / Self Care | Payer: 59 | Source: Ambulatory Visit | Attending: Obstetrics and Gynecology | Admitting: Obstetrics and Gynecology

## 2012-03-09 ENCOUNTER — Ambulatory Visit (HOSPITAL_COMMUNITY): Admission: RE | Admit: 2012-03-09 | Payer: BC Managed Care – PPO | Source: Ambulatory Visit

## 2012-03-09 DIAGNOSIS — N979 Female infertility, unspecified: Secondary | ICD-10-CM | POA: Insufficient documentation

## 2012-03-09 MED ORDER — IOHEXOL 300 MG/ML  SOLN: 10.0000 mL | Freq: Once | INTRAMUSCULAR | Status: AC | PRN

## 2013-02-07 ENCOUNTER — Encounter (HOSPITAL_COMMUNITY): Payer: Self-pay

## 2013-02-07 ENCOUNTER — Inpatient Hospital Stay (HOSPITAL_COMMUNITY)
Admission: AD | Admit: 2013-02-07 | Discharge: 2013-02-07 | Disposition: A | Discharge status: Home / Self Care | Payer: 59 | Source: Ambulatory Visit | Attending: Obstetrics & Gynecology | Admitting: Obstetrics & Gynecology

## 2013-02-07 DIAGNOSIS — O034 Incomplete spontaneous abortion without complication: Secondary | ICD-10-CM | POA: Insufficient documentation

## 2013-02-07 LAB — COMPREHENSIVE METABOLIC PANEL
ALT: 9 U/L (ref 0–35)
AST: 14 U/L (ref 0–37)
Albumin: 3.8 g/dL (ref 3.5–5.2)
CO2: 25 mEq/L (ref 19–32)
Calcium: 9.1 mg/dL (ref 8.4–10.5)
Chloride: 104 mEq/L (ref 96–112)
GFR calc non Af Amer: >90 mL/min (ref >90)
Sodium: 138 mEq/L (ref 135–145)
Total Bilirubin: 0.7 mg/dL (ref 0.3–1.2)

## 2013-02-07 LAB — HCG, QUANTITATIVE, PREGNANCY: hCG, Beta Chain, Quant, S: 87 m[IU]/mL — ABNORMAL HIGH (ref <5)

## 2013-02-07 NOTE — Progress Notes (Signed)
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  She is very focused on the physical healing right now and is aware that the emotional and spiritual healing can be a longer process.  She is aware of [facility] and is also aware of chaplain availability here no matter what length of time has passed.  [employer] Pager, [phone] 10:46 AM

## 2013-02-07 NOTE — MAU Note (Signed)
Patient is in to get follow bhcg and possibly get mtx injection for possible retained product of conception (she states that she had an IUP and a miscarriage but her bhcg is not dropping readily. From 1200 to 327.). She denies any pain, discomfort and vaginal bleeding.

## 2013-02-07 NOTE — Progress Notes (Signed)
Dr Rana Snare gave orders to obtain bhcg,  Cmet, mtx dosed by pharmacy.

## 2013-02-07 NOTE — MAU Note (Signed)
Patient after talking with dr Langston Masker by phone, decided to not get the MTX. She was advised that the recommendation is to wait 3-4 months to concieve. She states that she will call her doctor office for follow up.

## 2013-12-26 IMAGING — RF DG HYSTEROGRAM
5 series · 5 of 5 positions shown · non-contrast
Comparison: none

CLINICAL DATA: Infertility

HYSTEROSALPINGOGRAM
TECHNIQUE: Hysterosalpingogram was performed by the ordering
physician under fluoroscopy.  Fluoroscopic images are submitted for
interpretation following the procedure.
Fluoroscopy Time:  0.5 minutes.

[Series 1: run · 1 of 1 slices shown (1 of 5)]
[im 1/1]
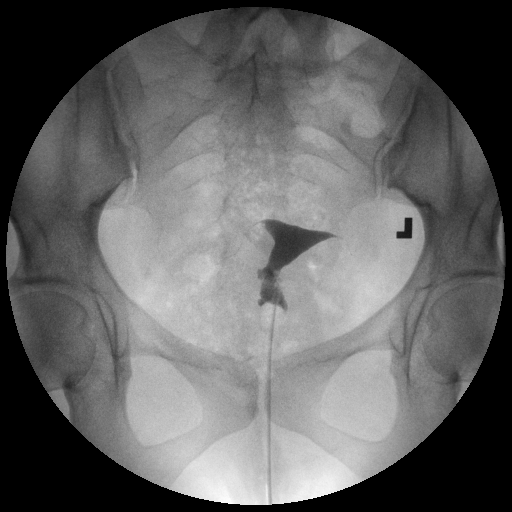

[Series 2: run · 1 of 1 slices shown (2 of 5)]
[im 1/1]
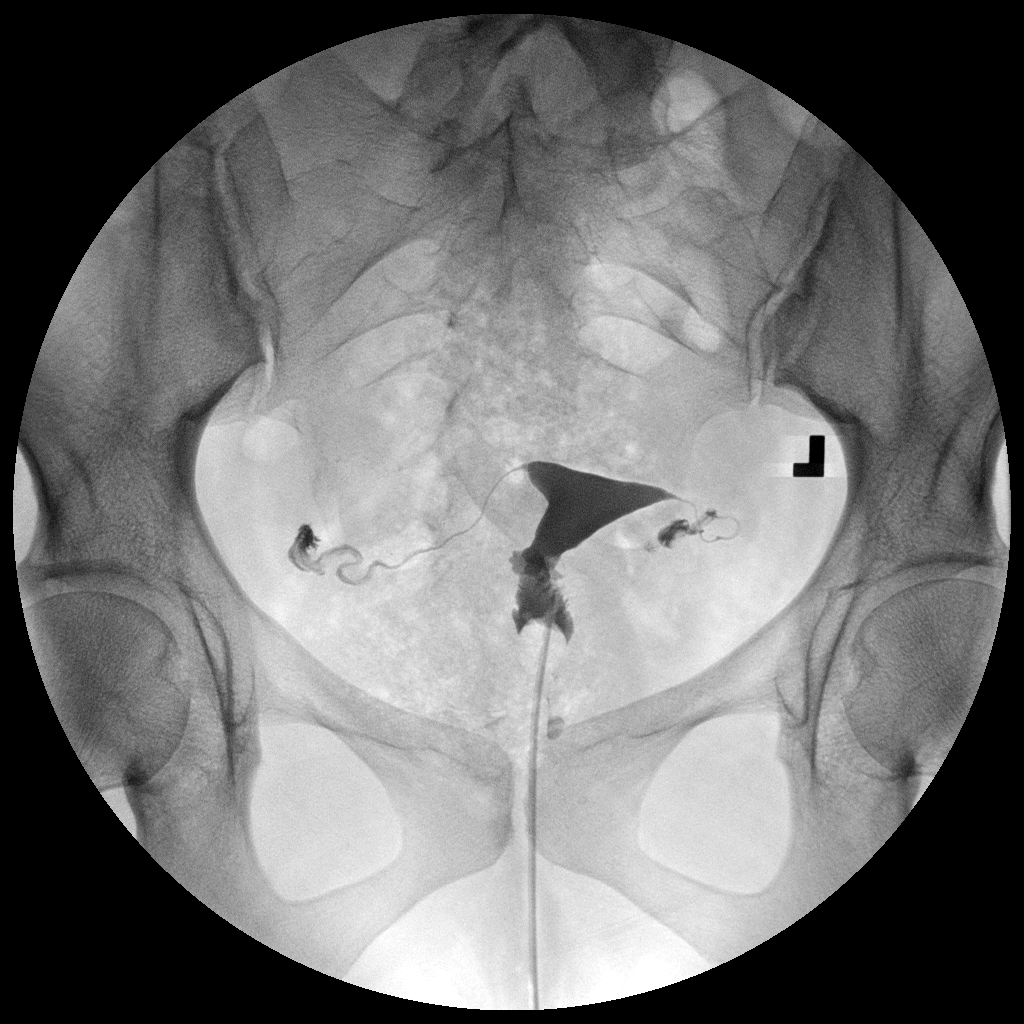

[Series 3: run · 1 of 1 slices shown (3 of 5)]
[im 1/1]
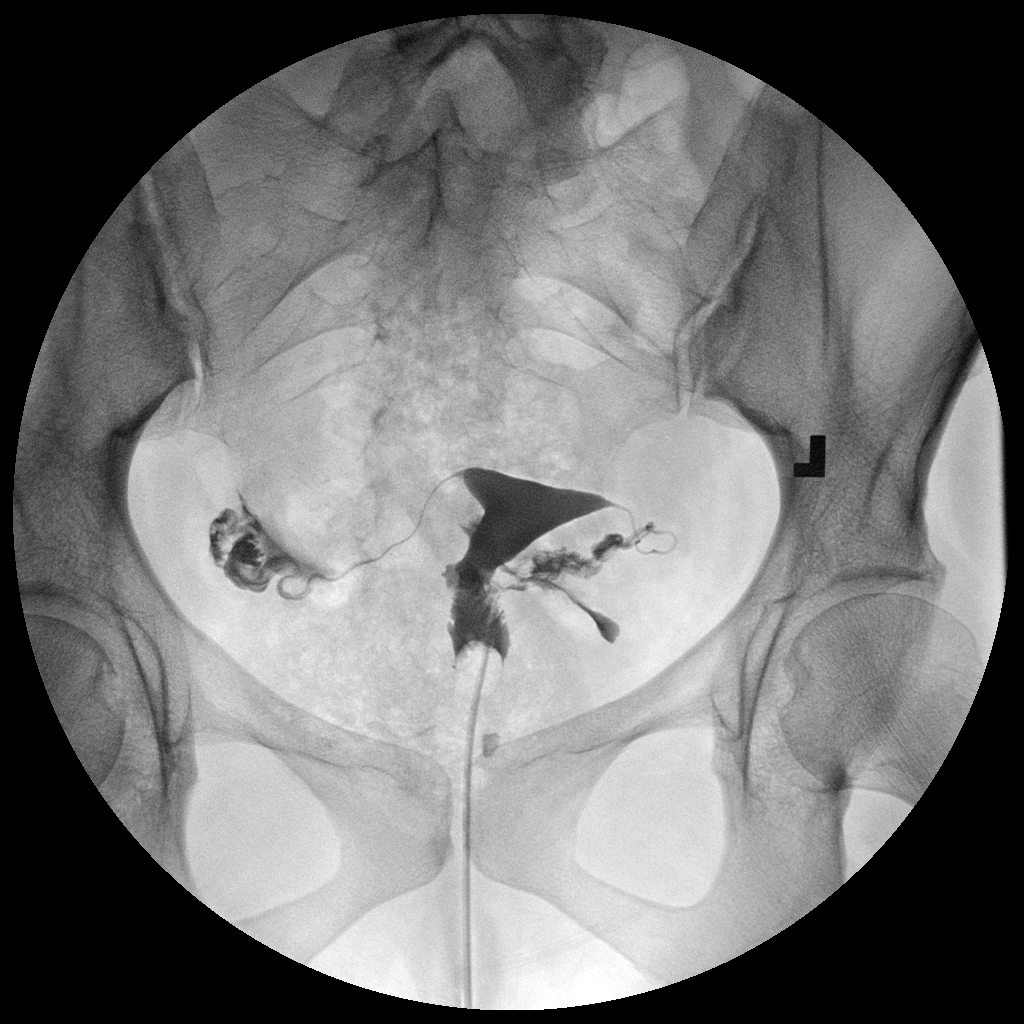

[Series 4: run · 1 of 1 slices shown (4 of 5)]
[im 1/1]
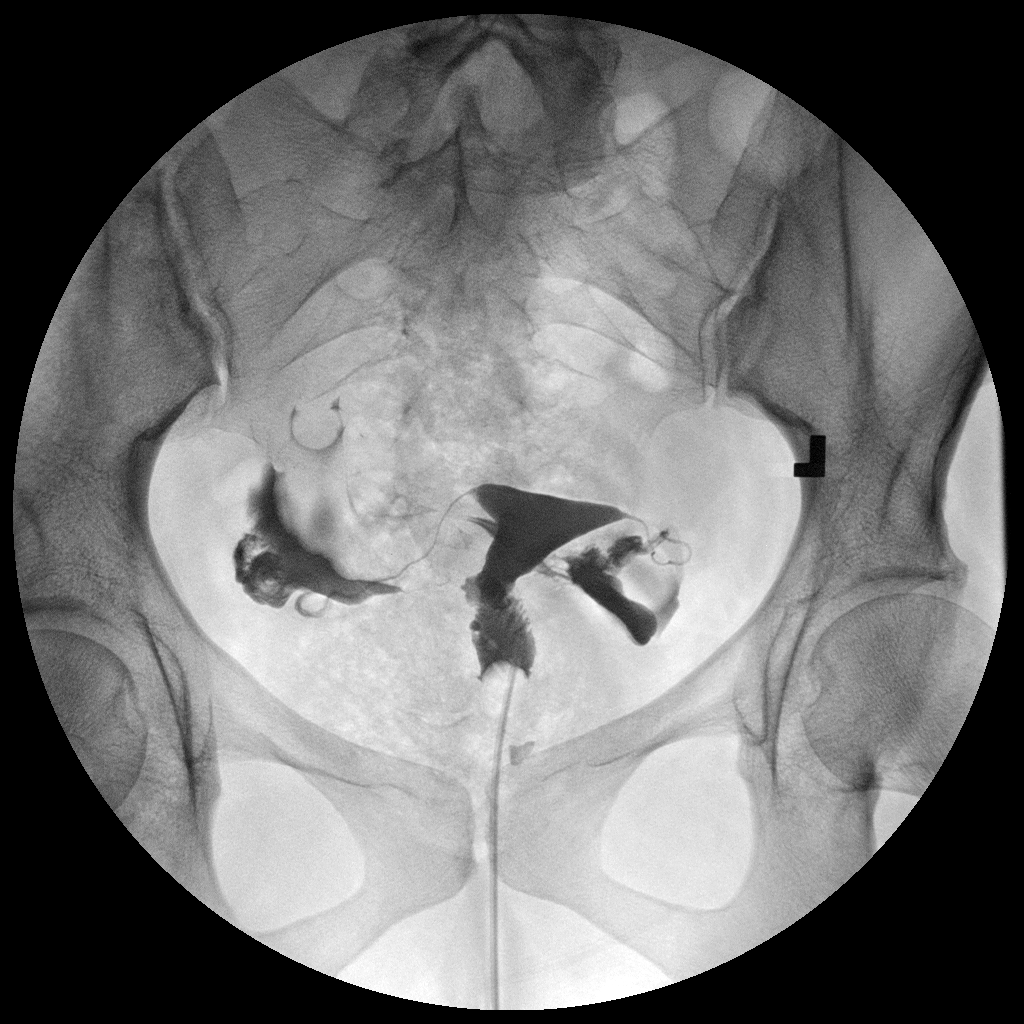

[Series 5: run · 1 of 1 slices shown (5 of 5)]
[im 1/1]
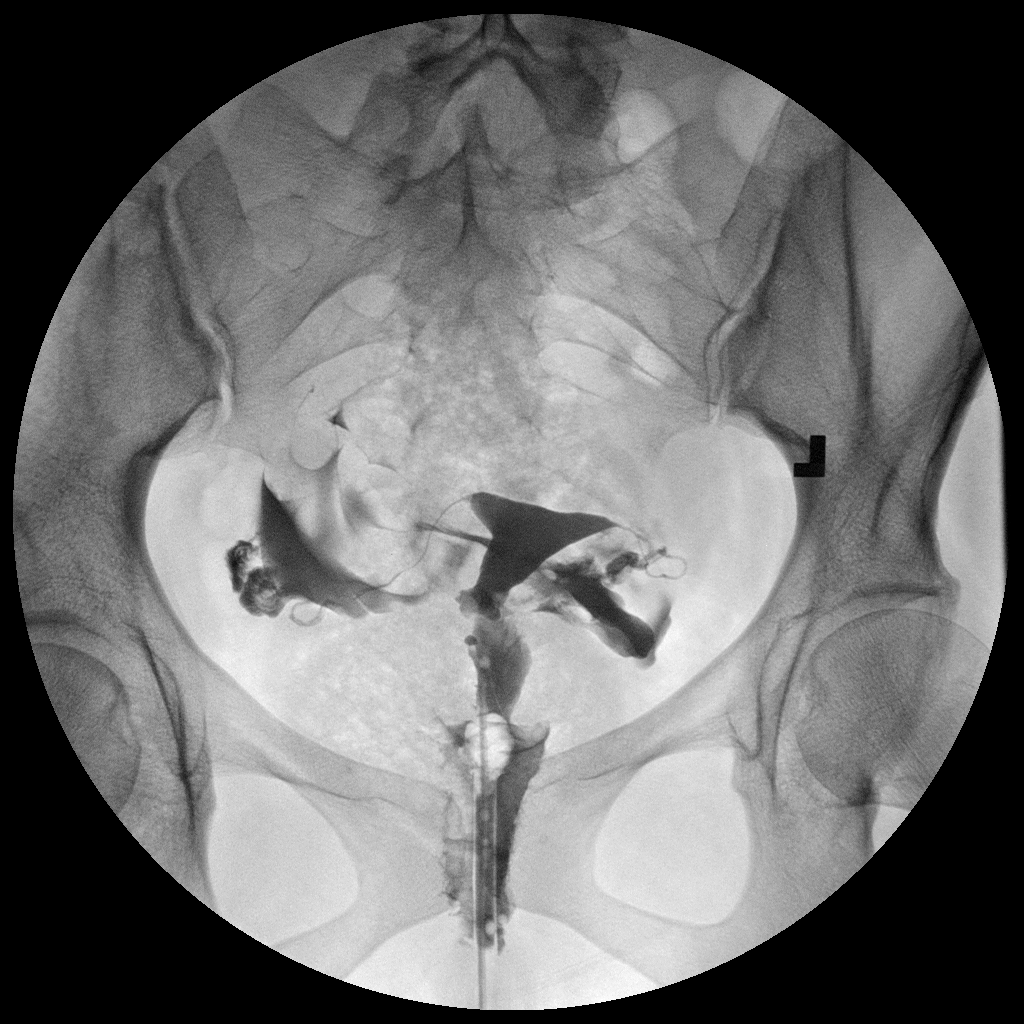

[5 of 5 positions shown; findings below may reference images not displayed]

FINDINGS: The endometrial cavity of the uterus is normal in contour
and appearance.

Contrast filling of both fallopian tubes is seen, and both tubes
are normal in appearance.  Intraperitoneal spill of the contrast
from both fallopian tubes is demonstrated.
IMPRESSION: Normal study.  Fallopian tubes are patent bilaterally.

## 2014-01-02 ENCOUNTER — Encounter (HOSPITAL_COMMUNITY): Payer: Self-pay

## 2014-01-08 ENCOUNTER — Encounter: Payer: Self-pay | Admitting: *Deleted

## 2014-02-14 LAB — OB RESULTS CONSOLE ABO/RH: RH Type: POSITIVE

## 2014-02-14 LAB — OB RESULTS CONSOLE HIV ANTIBODY (ROUTINE TESTING): HIV: NONREACTIVE

## 2014-02-14 LAB — OB RESULTS CONSOLE GC/CHLAMYDIA
CHLAMYDIA, DNA PROBE: NEGATIVE
GC PROBE AMP, GENITAL: NEGATIVE

## 2014-02-14 LAB — OB RESULTS CONSOLE RUBELLA ANTIBODY, IGM: RUBELLA: IMMUNE

## 2014-02-14 LAB — OB RESULTS CONSOLE GBS: STREP GROUP B AG: POSITIVE

## 2014-02-14 LAB — OB RESULTS CONSOLE HEPATITIS B SURFACE ANTIGEN: HEP B S AG: NEGATIVE

## 2014-02-14 LAB — OB RESULTS CONSOLE ANTIBODY SCREEN: Antibody Screen: NEGATIVE

## 2014-02-14 LAB — OB RESULTS CONSOLE RPR: RPR: NONREACTIVE

## 2014-02-27 ENCOUNTER — Inpatient Hospital Stay (HOSPITAL_COMMUNITY): Admission: AD | Admit: 2014-02-27 | Payer: 59 | Source: Ambulatory Visit | Admitting: Obstetrics and Gynecology

## 2014-03-03 NOTE — L&D Delivery Note (Signed)
Vtx at +3 station and ROA.  Pt desires VE.  I discussed the R&B of VE including but not limited to injury to fetus but the potential benefit of expedited delivery.  She gives her informed consent and wishes to proceed. On the 2nd  pull delivered viable female apgars 8,9 over 2nd degree ML lac and right periurethral   Placenta delivered spontaneously intact with 3VC. Repair with 2-0 and 3-0 Chromic with good support and hemostasis noted and R/V exam confirms.  PH art was sent.  [place] cord blood was not done.  Mother and baby were doing well.  EBL 100cc  Louretta Shorten, MD

## 2014-09-01 ENCOUNTER — Encounter (HOSPITAL_COMMUNITY): Payer: Self-pay | Admitting: *Deleted

## 2014-09-01 ENCOUNTER — Inpatient Hospital Stay (HOSPITAL_COMMUNITY)
Admission: AD | Admit: 2014-09-01 | Discharge: 2014-09-01 | Disposition: A | Discharge status: Home / Self Care | Payer: 59 | Source: Ambulatory Visit | Attending: Obstetrics and Gynecology | Admitting: Obstetrics and Gynecology

## 2014-09-01 DIAGNOSIS — Z3A37 37 weeks gestation of pregnancy: Secondary | ICD-10-CM | POA: Diagnosis not present

## 2014-09-01 DIAGNOSIS — O368130 Decreased fetal movements, third trimester, not applicable or unspecified: Secondary | ICD-10-CM | POA: Insufficient documentation

## 2014-09-01 DIAGNOSIS — Z3689 Encounter for other specified antenatal screening: Secondary | ICD-10-CM

## 2014-09-01 NOTE — Discharge Instructions (Signed)
Braxton Hicks Contractions °Contractions of the uterus can occur throughout pregnancy. Contractions are not always a sign that you are in labor.  °WHAT ARE BRAXTON HICKS CONTRACTIONS?  °Contractions that occur before labor are called Braxton Hicks contractions, or false labor. Toward the end of pregnancy (32-34 weeks), these contractions can develop more often and may become more forceful. This is not true labor because these contractions do not result in opening (dilatation) and thinning of the cervix. They are sometimes difficult to tell apart from true labor because these contractions can be forceful and people have different pain tolerances. You should not feel embarrassed if you go to the hospital with false labor. Sometimes, the only way to tell if you are in true labor is for your health care provider to look for changes in the cervix. °If there are no prenatal problems or other health problems associated with the pregnancy, it is completely safe to be sent home with false labor and await the onset of true labor. °HOW CAN YOU TELL THE DIFFERENCE BETWEEN TRUE AND FALSE LABOR? °False Labor °· The contractions of false labor are usually shorter and not as hard as those of true labor.   °· The contractions are usually irregular.   °· The contractions are often felt in the front of the lower abdomen and in the groin.   °· The contractions may go away when you walk around or change positions while lying down.   °· The contractions get weaker and are shorter lasting as time goes on.   °· The contractions do not usually become progressively stronger, regular, and closer together as with true labor.   °True Labor °· Contractions in true labor last 30-70 seconds, become very regular, usually become more intense, and increase in frequency.   °· The contractions do not go away with walking.   °· The discomfort is usually felt in the top of the uterus and spreads to the lower abdomen and low back.   °· True labor can be  determined by your health care provider with an exam. This will show that the cervix is dilating and getting thinner.   °WHAT TO REMEMBER °· Keep up with your usual exercises and follow other instructions given by your health care provider.   °· Take medicines as directed by your health care provider.   °· Keep your regular prenatal appointments.   °· Eat and drink lightly if you think you are going into labor.   °· If Braxton Hicks contractions are making you uncomfortable:   °¨ Change your position from lying down or resting to walking, or from walking to resting.   °¨ Sit and rest in a tub of warm water.   °¨ Drink 2-3 glasses of water. Dehydration may cause these contractions.   °¨ Do slow and deep breathing several times an hour.   °WHEN SHOULD I SEEK IMMEDIATE MEDICAL CARE? °Seek immediate medical care if: °· Your contractions become stronger, more regular, and closer together.   °· You have fluid leaking or gushing from your vagina.   °· You have a fever.   °· You pass blood-tinged mucus.   °· You have vaginal bleeding.   °· You have continuous abdominal pain.   °· You have low back pain that you never had before.   °· You feel your baby's head pushing down and causing pelvic pressure.   °· Your baby is not moving as much as it used to.   °Document Released: 02/17/2005 Document Revised: 02/22/2013 Document Reviewed: 11/29/2012 °ExitCare® Patient Information ©2015 [employer]. This information is not intended to replace advice given to you by your health care   provider. Make sure you discuss any questions you have with your health care provider. °Fetal Movement Counts °Patient Name: __________________________________________________ Patient Due Date: ____________________ °Performing a fetal movement count is highly recommended in high-risk pregnancies, but it is good for every pregnant woman to do. Your health care provider may ask you to start counting fetal movements at 28 weeks of the pregnancy. Fetal  movements often increase: °· After eating a full meal. °· After physical activity. °· After eating or drinking something sweet or cold. °· At rest. °Pay attention to when you feel the baby is most active. This will help you notice a pattern of your baby's sleep and wake cycles and what factors contribute to an increase in fetal movement. It is important to perform a fetal movement count at the same time each day when your baby is normally most active.  °HOW TO COUNT FETAL MOVEMENTS °· Find a quiet and comfortable area to sit or lie down on your left side. Lying on your left side provides the best blood and oxygen circulation to your baby. °· Write down the day and time on a sheet of paper or in a journal. °· Start counting kicks, flutters, swishes, rolls, or jabs in a 2-hour period. You should feel at least 10 movements within 2 hours. °· If you do not feel 10 movements in 2 hours, wait 2-3 hours and count again. Look for a change in the pattern or not enough counts in 2 hours. °SEEK MEDICAL CARE IF: °· You feel less than 10 counts in 2 hours, tried twice. °· There is no movement in over an hour. °· The pattern is changing or taking longer each day to reach 10 counts in 2 hours. °· You feel the baby is not moving as he or she usually does. °Date: ____________ Movements: ____________ Start time: ____________ Finish time: ____________  °Date: ____________ Movements: ____________ Start time: ____________ Finish time: ____________ °Date: ____________ Movements: ____________ Start time: ____________ Finish time: ____________ °Date: ____________ Movements: ____________ Start time: ____________ Finish time: ____________ °Date: ____________ Movements: ____________ Start time: ____________ Finish time: ____________ °Date: ____________ Movements: ____________ Start time: ____________ Finish time: ____________ °Date: ____________ Movements: ____________ Start time: ____________ Finish time: ____________ °Date: ____________  Movements: ____________ Start time: ____________ Finish time: ____________  °Date: ____________ Movements: ____________ Start time: ____________ Finish time: ____________ °Date: ____________ Movements: ____________ Start time: ____________ Finish time: ____________ °Date: ____________ Movements: ____________ Start time: ____________ Finish time: ____________ °Date: ____________ Movements: ____________ Start time: ____________ Finish time: ____________ °Date: ____________ Movements: ____________ Start time: ____________ Finish time: ____________ °Date: ____________ Movements: ____________ Start time: ____________ Finish time: ____________ °Date: ____________ Movements: ____________ Start time: ____________ Finish time: ____________  °Date: ____________ Movements: ____________ Start time: ____________ Finish time: ____________ °Date: ____________ Movements: ____________ Start time: ____________ Finish time: ____________ °Date: ____________ Movements: ____________ Start time: ____________ Finish time: ____________ °Date: ____________ Movements: ____________ Start time: ____________ Finish time: ____________ °Date: ____________ Movements: ____________ Start time: ____________ Finish time: ____________ °Date: ____________ Movements: ____________ Start time: ____________ Finish time: ____________ °Date: ____________ Movements: ____________ Start time: ____________ Finish time: ____________  °Date: ____________ Movements: ____________ Start time: ____________ Finish time: ____________ °Date: ____________ Movements: ____________ Start time: ____________ Finish time: ____________ °Date: ____________ Movements: ____________ Start time: ____________ Finish time: ____________ °Date: ____________ Movements: ____________ Start time: ____________ Finish time: ____________ °Date: ____________ Movements: ____________ Start time: ____________ Finish time: ____________ °Date: ____________ Movements: ____________ Start time:  ____________ Finish time: ____________ °Date: ____________ Movements: ____________   Start time: ____________ Finish time: ____________  °Date: ____________ Movements: ____________ Start time: ____________ Finish time: ____________ °Date: ____________ Movements: ____________ Start time: ____________ Finish time: ____________ °Date: ____________ Movements: ____________ Start time: ____________ Finish time: ____________ °Date: ____________ Movements: ____________ Start time: ____________ Finish time: ____________ °Date: ____________ Movements: ____________ Start time: ____________ Finish time: ____________ °Date: ____________ Movements: ____________ Start time: ____________ Finish time: ____________ °Date: ____________ Movements: ____________ Start time: ____________ Finish time: ____________  °Date: ____________ Movements: ____________ Start time: ____________ Finish time: ____________ °Date: ____________ Movements: ____________ Start time: ____________ Finish time: ____________ °Date: ____________ Movements: ____________ Start time: ____________ Finish time: ____________ °Date: ____________ Movements: ____________ Start time: ____________ Finish time: ____________ °Date: ____________ Movements: ____________ Start time: ____________ Finish time: ____________ °Date: ____________ Movements: ____________ Start time: ____________ Finish time: ____________ °Date: ____________ Movements: ____________ Start time: ____________ Finish time: ____________  °Date: ____________ Movements: ____________ Start time: ____________ Finish time: ____________ °Date: ____________ Movements: ____________ Start time: ____________ Finish time: ____________ °Date: ____________ Movements: ____________ Start time: ____________ Finish time: ____________ °Date: ____________ Movements: ____________ Start time: ____________ Finish time: ____________ °Date: ____________ Movements: ____________ Start time: ____________ Finish time: ____________ °Date:  ____________ Movements: ____________ Start time: ____________ Finish time: ____________ °Date: ____________ Movements: ____________ Start time: ____________ Finish time: ____________  °Date: ____________ Movements: ____________ Start time: ____________ Finish time: ____________ °Date: ____________ Movements: ____________ Start time: ____________ Finish time: ____________ °Date: ____________ Movements: ____________ Start time: ____________ Finish time: ____________ °Date: ____________ Movements: ____________ Start time: ____________ Finish time: ____________ °Date: ____________ Movements: ____________ Start time: ____________ Finish time: ____________ °Date: ____________ Movements: ____________ Start time: ____________ Finish time: ____________ °Document Released: 03/19/2006 Document Revised: 07/04/2013 Document Reviewed: 12/15/2011 °Exit[employer]® Patient Information ©2015 [employer]. This information is not intended to replace advice given to you by your health care provider. Make sure you discuss any questions you have with your health care provider. ° °

## 2014-09-01 NOTE — MAU Note (Signed)
Pt has had decreased FM all day, "baby has been sluggish."  States baby is usually very active.  Having occasional uc's, denies bleeding or LOF.

## 2014-09-01 NOTE — MAU Provider Note (Signed)
  History     CSN: [id number]  Arrival date and time: 09/01/14 1642   First Provider Initiated Contact with Patient 09/01/14 1718      Chief Complaint  Patient presents with  . Decreased Fetal Movement   HPI  Ms. Cheryl Stein is a 34 y.o. G3P1011 at [redacted]w[redacted]d who presents to MAU today with complaint of decreased fetal movement all day today. She states since arrival in MAU and placement of EFM she has noted normal fetal movement. She denies vaginal bleeding, LOF or complications with this pregnancy. She states few braxton hicks contractions. She plans to VBAC. Patient expressed concern due to outcome from last pregnancy when she presented for decreased fetal movement and they found baby was in distress and had to do an emergency C/S.   OB History    Gravida Para Term Preterm AB TAB SAB Ectopic Multiple Living   3 1 1  0 1 0 1 0 0 1      Past Medical History  Diagnosis Date  . Anemia   . Endometriosis     Past Surgical History  Procedure Laterality Date  . Laparoscopy    . Cesarean section  12/21/2010    Procedure: CESAREAN SECTION;  Surgeon: Luz Lex, MD;  Location: [facility];  Service: Gynecology;  Laterality: N/A;  Primary Cesarean Section with birth of baby boy @ 70    Family History  Problem Relation Age of Onset  . Hypertension Mother   . Parkinson's disease Father     History  Substance Use Topics  . Smoking status: Never Smoker   . Smokeless tobacco: Not on file  . Alcohol Use: No    Allergies:  Allergies  Allergen Reactions  . Codeine Nausea And Vomiting  . Latex Rash    No prescriptions prior to admission    Review of Systems  Constitutional: Negative for malaise/fatigue.  Gastrointestinal: Negative for abdominal pain.  Genitourinary:       Neg - vaginal bleeding, discharge, LOF   Physical Exam   Blood pressure 117/78, pulse 96, temperature 98.2 F (36.8 C), temperature source Oral, resp. rate 16.  Physical Exam  Nursing note and vitals  reviewed. Constitutional: She is oriented to person, place, and time. She appears well-developed and well-nourished. No distress.  HENT:  Head: Normocephalic and atraumatic.  Cardiovascular: Normal rate.   Respiratory: Effort normal.  GI: Soft. She exhibits no distension and no mass. There is no tenderness. There is no rebound and no guarding.  Neurological: She is alert and oriented to person, place, and time.  Skin: Skin is warm and dry. No erythema.  Psychiatric: She has a normal mood and affect.    Fetal Monitoring: Baseline: 120 bpm, moderate variability, + accelerations, no decelerations Contractions: one mild contraction noted  MAU Course  Procedures None  MDM Discussed patient with Dr. Monica Becton. Agrees with plan for discharge at this time with kick counts and follow-up as scheduled  Assessment and Plan  A: SIUP at [redacted]w[redacted]d Reactive NST  P: Discharge home Kick counts and labor precautions discussed Patient advised to follow-up with [facility] as scheduled for routine prenatal care or sooner PRN Patient may return to MAU as needed or if her condition were to change or worsen   Luvenia Redden, PA-C  09/01/2014, 5:50 PM

## 2014-09-12 ENCOUNTER — Telehealth (HOSPITAL_COMMUNITY): Payer: Self-pay | Admitting: *Deleted

## 2014-09-12 ENCOUNTER — Encounter (HOSPITAL_COMMUNITY): Payer: Self-pay | Admitting: *Deleted

## 2014-09-12 NOTE — Telephone Encounter (Signed)
Preadmission screen  

## 2014-09-13 ENCOUNTER — Encounter (HOSPITAL_COMMUNITY): Payer: Self-pay | Admitting: *Deleted

## 2014-09-13 ENCOUNTER — Telehealth (HOSPITAL_COMMUNITY): Payer: Self-pay | Admitting: *Deleted

## 2014-09-13 NOTE — Telephone Encounter (Signed)
Preadmission screen  

## 2014-09-15 ENCOUNTER — Other Ambulatory Visit (HOSPITAL_COMMUNITY): Payer: Self-pay | Admitting: Obstetrics and Gynecology

## 2014-09-17 VITALS — BP 90/68 | HR 86 | Temp 98.3°F | Resp 18 | Ht 67.0 in | Wt 161.0 lb

## 2014-09-18 ENCOUNTER — Inpatient Hospital Stay (HOSPITAL_COMMUNITY)
Admission: RE | Admit: 2014-09-18 | Discharge: 2014-09-19 | DRG: 775 | Disposition: A | Discharge status: Home / Self Care | Payer: 59 | Source: Ambulatory Visit | Attending: Obstetrics and Gynecology | Admitting: Obstetrics and Gynecology

## 2014-09-18 ENCOUNTER — Encounter (HOSPITAL_COMMUNITY): Payer: Self-pay

## 2014-09-18 ENCOUNTER — Inpatient Hospital Stay (HOSPITAL_COMMUNITY): Payer: 59 | Admitting: Anesthesiology

## 2014-09-18 DIAGNOSIS — O3421 Maternal care for scar from previous cesarean delivery: Secondary | ICD-10-CM | POA: Diagnosis present

## 2014-09-18 DIAGNOSIS — O99824 Streptococcus B carrier state complicating childbirth: Secondary | ICD-10-CM | POA: Diagnosis present

## 2014-09-18 DIAGNOSIS — O09523 Supervision of elderly multigravida, third trimester: Secondary | ICD-10-CM

## 2014-09-18 DIAGNOSIS — IMO0002 Reserved for concepts with insufficient information to code with codable children: Secondary | ICD-10-CM

## 2014-09-18 DIAGNOSIS — Z3A39 39 weeks gestation of pregnancy: Secondary | ICD-10-CM | POA: Diagnosis present

## 2014-09-18 DIAGNOSIS — Z833 Family history of diabetes mellitus: Secondary | ICD-10-CM

## 2014-09-18 DIAGNOSIS — O9989 Other specified diseases and conditions complicating pregnancy, childbirth and the puerperium: Secondary | ICD-10-CM | POA: Diagnosis present

## 2014-09-18 DIAGNOSIS — Z8249 Family history of ischemic heart disease and other diseases of the circulatory system: Secondary | ICD-10-CM | POA: Diagnosis not present

## 2014-09-18 DIAGNOSIS — Z820 Family history of epilepsy and other diseases of the nervous system: Secondary | ICD-10-CM | POA: Diagnosis not present

## 2014-09-18 LAB — TYPE AND SCREEN
ABO/RH(D): A POS
Antibody Screen: NEGATIVE

## 2014-09-18 LAB — CBC
HCT: 34.6 % — ABNORMAL LOW (ref 36.0–46.0)
HEMOGLOBIN: 11.7 g/dL — AB (ref 12.0–15.0)
MCH: 30.5 pg (ref 26.0–34.0)
MCHC: 33.8 g/dL (ref 30.0–36.0)
MCV: 90.3 fL (ref 78.0–100.0)
Platelets: 171 10*3/uL (ref 150–400)
RBC: 3.83 MIL/uL — ABNORMAL LOW (ref 3.87–5.11)
RDW: 13.3 % (ref 11.5–15.5)
WBC: 9.8 10*3/uL (ref 4.0–10.5)

## 2014-09-18 MED ORDER — ONDANSETRON HCL 4 MG/2ML IJ SOLN: 4.0000 mg | INTRAMUSCULAR | Status: DC | PRN

## 2014-09-18 MED ORDER — FLEET ENEMA 7-19 GM/118ML RE ENEM: 1.0000 | ENEMA | Freq: Once | RECTAL | Status: DC

## 2014-09-18 MED ORDER — DIPHENHYDRAMINE HCL 25 MG PO CAPS: 25.0000 mg | ORAL_CAPSULE | Freq: Four times a day (QID) | ORAL | Status: DC | PRN

## 2014-09-18 MED ORDER — FENTANYL 2.5 MCG/ML BUPIVACAINE 1/10 % EPIDURAL INFUSION (WH - ANES)
14.0000 mL/h | INTRAMUSCULAR | Status: DC | PRN
  Administered 2014-09-18: 14 mL/h via EPIDURAL
  Filled 2014-09-18: qty 125

## 2014-09-18 MED ORDER — OXYTOCIN 40 UNITS IN LACTATED RINGERS INFUSION - SIMPLE MED: 62.5000 mL/h | INTRAVENOUS | Status: DC

## 2014-09-18 MED ORDER — LACTATED RINGERS IV SOLN
500.0000 mL | INTRAVENOUS | Status: DC | PRN
  Administered 2014-09-18: 500 mL via INTRAVENOUS

## 2014-09-18 MED ORDER — SIMETHICONE 80 MG PO CHEW: 80.0000 mg | CHEWABLE_TABLET | ORAL | Status: DC | PRN

## 2014-09-18 MED ORDER — ACETAMINOPHEN 325 MG PO TABS: 650.0000 mg | ORAL_TABLET | ORAL | Status: DC | PRN

## 2014-09-18 MED ORDER — OXYCODONE-ACETAMINOPHEN 5-325 MG PO TABS
1.0000 | ORAL_TABLET | ORAL | Status: DC | PRN
  Administered 2014-09-19: 1 via ORAL
  Filled 2014-09-18: qty 1

## 2014-09-18 MED ORDER — PRENATAL MULTIVITAMIN CH
1.0000 | ORAL_TABLET | Freq: Every day | ORAL | Status: DC
  Administered 2014-09-19: 1 via ORAL
  Filled 2014-09-18: qty 1

## 2014-09-18 MED ORDER — ONDANSETRON HCL 4 MG/2ML IJ SOLN: 4.0000 mg | Freq: Four times a day (QID) | INTRAMUSCULAR | Status: DC | PRN

## 2014-09-18 MED ORDER — DIBUCAINE 1 % RE OINT
1.0000 "application " | TOPICAL_OINTMENT | RECTAL | Status: DC | PRN
  Filled 2014-09-18: qty 28

## 2014-09-18 MED ORDER — LIDOCAINE HCL (PF) 1 % IJ SOLN
30.0000 mL | INTRAMUSCULAR | Status: DC | PRN
  Administered 2014-09-18: 30 mL via SUBCUTANEOUS
  Filled 2014-09-18: qty 30

## 2014-09-18 MED ORDER — OXYTOCIN 40 UNITS IN LACTATED RINGERS INFUSION - SIMPLE MED
1.0000 m[IU]/min | INTRAVENOUS | Status: DC
  Administered 2014-09-18: 1 m[IU]/min via INTRAVENOUS
  Filled 2014-09-18: qty 1000

## 2014-09-18 MED ORDER — MEDROXYPROGESTERONE ACETATE 150 MG/ML IM SUSP: 150.0000 mg | INTRAMUSCULAR | Status: DC | PRN

## 2014-09-18 MED ORDER — DIPHENHYDRAMINE HCL 50 MG/ML IJ SOLN: 12.5000 mg | INTRAMUSCULAR | Status: DC | PRN

## 2014-09-18 MED ORDER — ONDANSETRON HCL 4 MG PO TABS: 4.0000 mg | ORAL_TABLET | ORAL | Status: DC | PRN

## 2014-09-18 MED ORDER — BENZOCAINE-MENTHOL 20-0.5 % EX AERO
1.0000 "application " | INHALATION_SPRAY | CUTANEOUS | Status: DC | PRN
  Administered 2014-09-19: 1 via TOPICAL
  Filled 2014-09-18 (×2): qty 56

## 2014-09-18 MED ORDER — SODIUM CHLORIDE 0.9 % IV SOLN
1.0000 g | Freq: Four times a day (QID) | INTRAVENOUS | Status: DC
  Administered 2014-09-18: 1 g via INTRAVENOUS
  Filled 2014-09-18 (×3): qty 1000

## 2014-09-18 MED ORDER — BUTORPHANOL TARTRATE 1 MG/ML IJ SOLN: 1.0000 mg | INTRAMUSCULAR | Status: DC | PRN

## 2014-09-18 MED ORDER — TETANUS-DIPHTH-ACELL PERTUSSIS 5-2.5-18.5 LF-MCG/0.5 IM SUSP
0.5000 mL | Freq: Once | INTRAMUSCULAR | Status: DC
  Filled 2014-09-18: qty 0.5

## 2014-09-18 MED ORDER — OXYCODONE-ACETAMINOPHEN 5-325 MG PO TABS: 2.0000 | ORAL_TABLET | ORAL | Status: DC | PRN

## 2014-09-18 MED ORDER — LIDOCAINE HCL (PF) 1 % IJ SOLN
INTRAMUSCULAR | Status: DC | PRN
  Administered 2014-09-18 (×2): 4 mL via EPIDURAL

## 2014-09-18 MED ORDER — AMPICILLIN SODIUM 2 G IJ SOLR
2.0000 g | Freq: Once | INTRAMUSCULAR | Status: AC
  Administered 2014-09-18: 2 g via INTRAVENOUS
  Filled 2014-09-18: qty 2000

## 2014-09-18 MED ORDER — PHENYLEPHRINE 40 MCG/ML (10ML) SYRINGE FOR IV PUSH (FOR BLOOD PRESSURE SUPPORT): 80.0000 ug | PREFILLED_SYRINGE | INTRAVENOUS | Status: DC | PRN

## 2014-09-18 MED ORDER — PROMETHAZINE HCL 25 MG/ML IJ SOLN: 12.5000 mg | INTRAMUSCULAR | Status: DC | PRN

## 2014-09-18 MED ORDER — EPHEDRINE 5 MG/ML INJ: 10.0000 mg | INTRAVENOUS | Status: DC | PRN

## 2014-09-18 MED ORDER — LACTATED RINGERS IV SOLN
INTRAVENOUS | Status: DC
  Administered 2014-09-18: 125 mL/h via INTRAVENOUS
  Administered 2014-09-18: 01:00:00 via INTRAVENOUS
  Administered 2014-09-18: 125 mL/h via INTRAVENOUS

## 2014-09-18 MED ORDER — ZOLPIDEM TARTRATE 5 MG PO TABS: 5.0000 mg | ORAL_TABLET | Freq: Every evening | ORAL | Status: DC | PRN

## 2014-09-18 MED ORDER — LANOLIN HYDROUS EX OINT: TOPICAL_OINTMENT | CUTANEOUS | Status: DC | PRN

## 2014-09-18 MED ORDER — CITRIC ACID-SODIUM CITRATE 334-500 MG/5ML PO SOLN: 30.0000 mL | ORAL | Status: DC | PRN

## 2014-09-18 MED ORDER — FENTANYL 2.5 MCG/ML BUPIVACAINE 1/10 % EPIDURAL INFUSION (WH - ANES): 14.0000 mL/h | INTRAMUSCULAR | Status: DC | PRN

## 2014-09-18 MED ORDER — OXYTOCIN 40 UNITS IN LACTATED RINGERS INFUSION - SIMPLE MED
INTRAVENOUS | Status: AC
  Filled 2014-09-18: qty 1000

## 2014-09-18 MED ORDER — TERBUTALINE SULFATE 1 MG/ML IJ SOLN: 0.2500 mg | Freq: Once | INTRAMUSCULAR | Status: DC | PRN

## 2014-09-18 MED ORDER — PHENYLEPHRINE 40 MCG/ML (10ML) SYRINGE FOR IV PUSH (FOR BLOOD PRESSURE SUPPORT)
80.0000 ug | PREFILLED_SYRINGE | INTRAVENOUS | Status: DC | PRN
  Filled 2014-09-18: qty 20

## 2014-09-18 MED ORDER — IBUPROFEN 600 MG PO TABS
600.0000 mg | ORAL_TABLET | Freq: Four times a day (QID) | ORAL | Status: DC
  Administered 2014-09-18 – 2014-09-19 (×4): 600 mg via ORAL
  Filled 2014-09-18 (×4): qty 1

## 2014-09-18 MED ORDER — MEASLES, MUMPS & RUBELLA VAC ~~LOC~~ INJ
0.5000 mL | INJECTION | Freq: Once | SUBCUTANEOUS | Status: DC
  Filled 2014-09-18: qty 0.5

## 2014-09-18 MED ORDER — WITCH HAZEL-GLYCERIN EX PADS: 1.0000 "application " | MEDICATED_PAD | CUTANEOUS | Status: DC | PRN

## 2014-09-18 MED ORDER — SENNOSIDES-DOCUSATE SODIUM 8.6-50 MG PO TABS
2.0000 | ORAL_TABLET | ORAL | Status: DC
  Administered 2014-09-18: 2 via ORAL
  Filled 2014-09-18: qty 2

## 2014-09-18 MED ORDER — OXYTOCIN BOLUS FROM INFUSION
500.0000 mL | INTRAVENOUS | Status: DC
  Administered 2014-09-18: 500 mL via INTRAVENOUS

## 2014-09-18 NOTE — Progress Notes (Signed)
Attempted for about 20 minutes to calibrate monica wireless monitior without success

## 2014-09-18 NOTE — Anesthesia Preprocedure Evaluation (Signed)
Anesthesia Evaluation  Patient identified by MRN, date of birth, ID band Patient awake    Reviewed: Allergy & Precautions, Patient's Chart, lab work & pertinent test results  Airway Mallampati: II  TM Distance: >3 FB Neck ROM: Full    Dental no notable dental hx. (+) Teeth Intact   Pulmonary neg pulmonary ROS,  breath sounds clear to auscultation  Pulmonary exam normal       Cardiovascular negative cardio ROS Normal cardiovascular examRhythm:Regular Rate:Normal     Neuro/Psych negative neurological ROS  negative psych ROS   GI/Hepatic negative GI ROS, Neg liver ROS,   Endo/Other  negative endocrine ROS  Renal/GU negative Renal ROS  negative genitourinary   Musculoskeletal negative musculoskeletal ROS (+)   Abdominal   Peds  Hematology  (+) anemia ,   Anesthesia Other Findings   Reproductive/Obstetrics (+) Pregnancy Previous C/section AMA                             Anesthesia Physical Anesthesia Plan  ASA: II  Anesthesia Plan: Epidural   Post-op Pain Management:    Induction:   Airway Management Planned: Natural Airway  Additional Equipment:   Intra-op Plan:   Post-operative Plan:   Informed Consent: I have reviewed the patients History and Physical, chart, labs and discussed the procedure including the risks, benefits and alternatives for the proposed anesthesia with the patient or authorized representative who has indicated his/her understanding and acceptance.     Plan Discussed with: Anesthesiologist  Anesthesia Plan Comments:         Anesthesia Quick Evaluation

## 2014-09-18 NOTE — Progress Notes (Signed)
Attempting beacon again

## 2014-09-18 NOTE — Progress Notes (Signed)
Care of patient handed off to [facility]

## 2014-09-18 NOTE — H&P (Signed)
Cheryl Stein is a 34 y.o. female presenting for IOL for social reasons.  History of Stein/S for fetal distress.  Pt nervious due to fact she presented in labor last preg and her child was in distress and she had no clue.  She has had normal U/S and NSTs.  Prev LSTCS desires repeat.  GBS neg History OB History    Gravida Para Term Preterm AB TAB SAB Ectopic Multiple Living   5 1 1  0 3 0 3 0 0 1     Past Medical History  Diagnosis Date  . Anemia   . Endometriosis   . Hx of varicella   . AMA (advanced maternal age) multigravida 35+    Past Surgical History  Procedure Laterality Date  . Laparoscopy    . Cesarean section  12/21/2010    Procedure: CESAREAN SECTION;  Surgeon: Luz Lex, MD;  Location: [facility];  Service: Gynecology;  Laterality: N/A;  Primary Cesarean Section with birth of baby boy @ 45  . Wisdom tooth extraction    . Hymenectomy     Family History: family history includes Birth defects in her brother; Diabetes in her maternal grandfather and paternal grandmother; Hypertension in her mother; Parkinson's disease in her father. Social History:  reports that she has never smoked. She has never used smokeless tobacco. She reports that she does not drink alcohol or use illicit drugs.   Prenatal Transfer Tool  Maternal Diabetes: No Genetic Screening: Normal Maternal Ultrasounds/Referrals: Normal Fetal Ultrasounds or other Referrals:  None Maternal Substance Abuse:  No Significant Maternal Medications:  None Significant Maternal Lab Results:  None Other Comments:  None  ROS  Dilation: 2 Effacement (%): 80 Station: -1 Exam by:: Dr Corinna Capra Blood pressure 120/76, pulse 95, temperature 98.3 F (36.8 Stein), temperature source Oral, resp. rate 16, height 5\' 7"  (1.702 m), weight 161 lb (73.029 kg). Exam Physical Exam  Prenatal labs: ABO, Rh: --/--/A POS (07/18 0135) Antibody: NEG (07/18 0135) Rubella: Immune (12/15 0000) RPR: Nonreactive (12/15 0000)  HBsAg: Negative  (12/15 0000)  HIV: Non-reactive (12/15 0000)  GBS: Positive (12/15 0000)   Assessment/Plan: IUP at term for IOL Prev Stein/S desires TOL for VBAC attempt.  R&B discussed at length and informed consent obtained. AROM/Pitocin Abx for GBS   Cheryl Stein 09/18/2014, 8:46 AM

## 2014-09-18 NOTE — Anesthesia Procedure Notes (Signed)
Epidural Patient location during procedure: OB Start time: 09/18/2014 11:39 AM  Staffing Anesthesiologist: Josephine Igo Performed by: anesthesiologist   Preanesthetic Checklist Completed: patient identified, site marked, surgical consent, pre-op evaluation, timeout performed, IV checked, risks and benefits discussed and monitors and equipment checked  Epidural Patient position: sitting Prep: site prepped and draped and DuraPrep Patient monitoring: continuous pulse ox and blood pressure Approach: midline Location: L3-L4 Injection technique: LOR air  Needle:  Needle type: Tuohy  Needle gauge: 17 G Needle length: 9 cm and 9 Needle insertion depth: 4 cm Catheter type: closed end flexible Catheter size: 19 Gauge Catheter at skin depth: 9 cm Test dose: negative and Other  Assessment Events: blood not aspirated, injection not painful, no injection resistance, negative IV test and no paresthesia  Additional Notes Patient identified. Risks and benefits discussed including failed block, incomplete  Pain control, post dural puncture headache, nerve damage, paralysis, blood pressure Changes, nausea, vomiting, reactions to medications-both toxic and allergic and post Partum back pain. All questions were answered. Patient expressed understanding and wished to proceed. Sterile technique was used throughout procedure. Epidural site was Dressed with sterile barrier dressing. No paresthesias, signs of intravascular injection Or signs of intrathecal spread were encountered.  Patient was more comfortable after the epidural was dosed. Please see RN's note for documentation of vital signs and FHR which are stable.

## 2014-09-19 LAB — CBC
HEMATOCRIT: 32.8 % — AB (ref 36.0–46.0)
Hemoglobin: 10.8 g/dL — ABNORMAL LOW (ref 12.0–15.0)
MCH: 30 pg (ref 26.0–34.0)
MCHC: 32.9 g/dL (ref 30.0–36.0)
MCV: 91.1 fL (ref 78.0–100.0)
PLATELETS: 158 10*3/uL (ref 150–400)
RBC: 3.6 MIL/uL — ABNORMAL LOW (ref 3.87–5.11)
RDW: 13.4 % (ref 11.5–15.5)
WBC: 10.5 10*3/uL (ref 4.0–10.5)

## 2014-09-19 LAB — RPR: RPR: NONREACTIVE

## 2014-09-19 NOTE — Lactation Note (Signed)
This note was copied from the chart of Cheryl Marlette Curvin. Lactation Consultation Note Experienced BF mom BF her son for 18 months and only stopped then d/t taking fertility medication that wasn't suitable for BF. Mom is involved in [place]. States BF is going great. Mom had BF in side lying position. Has good everted nipples and states has good colostrum and has heard swallows.  Highlighted some BF tip encouraging STS during BF, cluster feeding and I&O. Educated about newborn behavior. [place] brochure given w/resources, support groups and [place] services.Encouraged to call for assistance if needed and to verify proper latch.  Patient Name: Cheryl Stein [id number] Date: 09/19/2014 Reason for consult: Initial assessment   Maternal Data Has patient been taught Hand Expression?: Yes Does the patient have breastfeeding experience prior to this delivery?: Yes  Feeding Feeding Type: Breast Fed  LATCH Score/Interventions       Type of Nipple: Everted at rest and after stimulation  Comfort (Breast/Nipple): Soft / non-tender     Hold (Positioning): Assistance needed to correctly position infant at breast and maintain latch. Intervention(s): Breastfeeding basics reviewed;Support Pillows;Position options;Skin to skin     Lactation Tools Discussed/Used     Consult Status Consult Status: PRN Date: 09/20/14 Follow-up type: In-patient    Theodoro Kalata 09/19/2014, 5:34 AM

## 2014-09-19 NOTE — Lactation Note (Signed)
This note was copied from the chart of Cheryl Sherree Shankman. Lactation Consultation Note  Patient Name: Cheryl Stein Date: 09/19/2014 Reason for consult: Follow-up assessment Mom reports baby is nursing well and hopes for early d/c today. Mom is experienced BF, denies questions/concerns. Understands how to manage engorgement should that occur. Advised of OP services and support group. Encouraged to call if would like assist before d/c home.   Maternal Data    Feeding Feeding Type: Breast Milk Length of feed: 30 min  LATCH Score/Interventions                      Lactation Tools Discussed/Used     Consult Status Consult Status: Complete Date: 09/19/14 Follow-up type: In-patient    Katrine Coho 09/19/2014, 10:40 AM

## 2014-09-19 NOTE — Progress Notes (Signed)
Patient without complaint. May want to go home later today.  BP 90/68 mmHg  Pulse 86  Temp(Src) 98.3 F (36.8 C) (Oral)  Resp 18  Ht 5\' 7"  (1.702 m)  Wt 73.029 kg (161 lb)  BMI 25.21 kg/m2  SpO2 99%  Breastfeeding? Unknown Abdomen is soft and non tender  Results for orders placed or performed during the hospital encounter of 09/18/14 (from the past 24 hour(s))  CBC     Status: Abnormal   Collection Time: 09/19/14  5:45 AM  Result Value Ref Range   WBC 10.5 4.0 - 10.5 K/uL   RBC 3.60 (L) 3.87 - 5.11 MIL/uL   Hemoglobin 10.8 (L) 12.0 - 15.0 g/dL   HCT 32.8 (L) 36.0 - 46.0 %   MCV 91.1 78.0 - 100.0 fL   MCH 30.0 26.0 - 34.0 pg   MCHC 32.9 30.0 - 36.0 g/dL   RDW 13.4 11.5 - 15.5 %   Platelets 158 150 - 400 K/uL   IMPRESSION: PPD #1 Doing well Routine care Possible discharge later today

## 2014-09-19 NOTE — Anesthesia Postprocedure Evaluation (Signed)
  Anesthesia Post-op Note  Patient: Cheryl Stein  Procedure(s) Performed: * No procedures listed *  Patient Location: PACU and Mother/Baby  Anesthesia Type:Epidural  Level of Consciousness: awake, alert , oriented and patient cooperative  Airway and Oxygen Therapy: Patient Spontanous Breathing  Post-op Pain: none  Post-op Assessment: Post-op Vital signs reviewed, No signs of Nausea or vomiting, Adequate PO intake, Pain level controlled, No headache and No backache              Post-op Vital Signs: Reviewed and stable  Last Vitals:  Filed Vitals:   09/19/14 0524  BP: 90/68  Pulse: 86  Temp: 36.8 C  Resp: 18    Complications: No apparent anesthesia complications

## 2014-09-19 NOTE — Discharge Summary (Signed)
Obstetric Discharge Summary Reason for Admission: induction of labor Prenatal Procedures: none Intrapartum Procedures: vacuum Postpartum Procedures: none Complications-Operative and Postpartum: 2nd degree perineal laceration HEMOGLOBIN  Date Value Ref Range Status  09/19/2014 10.8* 12.0 - 15.0 g/dL Final   HCT  Date Value Ref Range Status  09/19/2014 32.8* 36.0 - 46.0 % Final    Physical Exam:  General: alert, cooperative and appears stated age 34: appropriate Uterine Fundus: firm Incision: healing well, no significant drainage DVT Evaluation: No evidence of DVT seen on physical exam.  Discharge Diagnoses: Term Pregnancy-delivered  Discharge Information: Date: 09/19/2014 Activity: pelvic rest Diet: routine Medications: None Condition: stable Instructions: refer to practice specific booklet Discharge to: home   Newborn Data: Live born female  Birth Weight: 6 lb 5.1 oz (2866 g) APGAR: 8, 9  Home with mother.  Cheryl Stein L 09/19/2014, 4:53 PM

## 2014-10-31 ENCOUNTER — Other Ambulatory Visit: Payer: Self-pay | Admitting: Obstetrics and Gynecology

## 2014-11-01 LAB — CYTOLOGY - PAP

## 2016-02-12 ENCOUNTER — Encounter (HOSPITAL_COMMUNITY): Payer: Self-pay

## 2016-02-14 MED ORDER — CEFOTETAN DISODIUM 2 G IJ SOLR
2.0000 g | INTRAMUSCULAR | Status: AC
  Administered 2016-02-15: 2 g via INTRAVENOUS
  Filled 2016-02-14: qty 2

## 2016-02-14 NOTE — H&P (Addendum)
  Cheryl Stein is a 74 ye G6p2A3 at [redacted] weeks EGA presented to the office 3 days ago for routine obstetrical care.  Embryonic demise of hydroptic fetus confirmed with [place].  She presents for D&E.  Normal NIPT.  GBS+  O:  Physical exam:  General:  Alert and oriented.  Normocephalic and atraumatic.  No thyromegaly or masses palpated.  Heart is regular rate and rhythm without murmur.  Lungs are clear to auscultation bilaterally.  Breasts without masses, lymphadenopathy or discharge.  Abdomen is soft, nontender, nondistended.  No rebound or guarding.  No flank pain is noted.  Extremities without clubbing, cyanosis or edema.  Normal external genitalia, Bartholin, Skene's and urethra.  Vagina without lesions.  Cervix within normal limits.  Uterus is anteverted, mobile and nontender. 14 weeks size No adnexal masses are palpable.  No inguinal lymphadenopathy palpable.    A/P 14 week embryonic demise Plan D&E with genetic studies.  R&B discussed and informed consent obtained.  Blood type A+ DL  02/15/16 1300 This patient has been seen and examined.   All of her questions were answered.  Labs and vital signs reviewed.  Informed consent has been obtained.  The History and Physical is current. DL

## 2016-02-15 ENCOUNTER — Ambulatory Visit (HOSPITAL_COMMUNITY)
Admission: RE | Admit: 2016-02-15 | Discharge: 2016-02-15 | Disposition: A | Discharge status: Home / Self Care | Payer: 59 | Source: Ambulatory Visit | Attending: Obstetrics and Gynecology | Admitting: Obstetrics and Gynecology

## 2016-02-15 ENCOUNTER — Encounter (HOSPITAL_COMMUNITY): Payer: Self-pay

## 2016-02-15 ENCOUNTER — Encounter (HOSPITAL_COMMUNITY)
Admission: RE | Disposition: A | Discharge status: Home / Self Care | Payer: Self-pay | Source: Ambulatory Visit | Attending: Obstetrics and Gynecology

## 2016-02-15 ENCOUNTER — Ambulatory Visit (HOSPITAL_COMMUNITY): Payer: 59 | Admitting: Anesthesiology

## 2016-02-15 DIAGNOSIS — Z91040 Latex allergy status: Secondary | ICD-10-CM | POA: Diagnosis not present

## 2016-02-15 DIAGNOSIS — Z885 Allergy status to narcotic agent status: Secondary | ICD-10-CM | POA: Diagnosis not present

## 2016-02-15 DIAGNOSIS — Z3A14 14 weeks gestation of pregnancy: Secondary | ICD-10-CM | POA: Diagnosis not present

## 2016-02-15 DIAGNOSIS — K219 Gastro-esophageal reflux disease without esophagitis: Secondary | ICD-10-CM | POA: Diagnosis not present

## 2016-02-15 DIAGNOSIS — O021 Missed abortion: Secondary | ICD-10-CM | POA: Insufficient documentation

## 2016-02-15 DIAGNOSIS — O09522 Supervision of elderly multigravida, second trimester: Secondary | ICD-10-CM | POA: Insufficient documentation

## 2016-02-15 DIAGNOSIS — O99012 Anemia complicating pregnancy, second trimester: Secondary | ICD-10-CM | POA: Insufficient documentation

## 2016-02-15 DIAGNOSIS — O99612 Diseases of the digestive system complicating pregnancy, second trimester: Secondary | ICD-10-CM | POA: Diagnosis not present

## 2016-02-15 DIAGNOSIS — D649 Anemia, unspecified: Secondary | ICD-10-CM | POA: Insufficient documentation

## 2016-02-15 DIAGNOSIS — O34219 Maternal care for unspecified type scar from previous cesarean delivery: Secondary | ICD-10-CM | POA: Insufficient documentation

## 2016-02-15 HISTORY — DX: Nonspecific low blood-pressure reading: R03.1

## 2016-02-15 HISTORY — PX: DILATION AND EVACUATION: SHX1459

## 2016-02-15 HISTORY — DX: Family history of other specified conditions: Z84.89

## 2016-02-15 HISTORY — DX: Gastro-esophageal reflux disease without esophagitis: K21.9

## 2016-02-15 HISTORY — DX: Recurrent pregnancy loss: N96

## 2016-02-15 LAB — CBC
HCT: 37.9 % (ref 36.0–46.0)
Hemoglobin: 13.5 g/dL (ref 12.0–15.0)
MCH: 29.4 pg (ref 26.0–34.0)
MCHC: 35.6 g/dL (ref 30.0–36.0)
MCV: 82.6 fL (ref 78.0–100.0)
Platelets: 236 K/uL (ref 150–400)
RBC: 4.59 MIL/uL (ref 3.87–5.11)
RDW: 13.7 % (ref 11.5–15.5)
WBC: 8.9 K/uL (ref 4.0–10.5)

## 2016-02-15 SURGERY — DILATION AND EVACUATION, UTERUS, SECOND TRIMESTER
Anesthesia: Monitor Anesthesia Care

## 2016-02-15 MED ORDER — ONDANSETRON HCL 4 MG/2ML IJ SOLN
INTRAMUSCULAR | Status: DC | PRN
  Administered 2016-02-15: 4 mg via INTRAVENOUS

## 2016-02-15 MED ORDER — MIDAZOLAM HCL 2 MG/2ML IJ SOLN
INTRAMUSCULAR | Status: AC
  Filled 2016-02-15: qty 2

## 2016-02-15 MED ORDER — FENTANYL CITRATE (PF) 100 MCG/2ML IJ SOLN
INTRAMUSCULAR | Status: AC
  Filled 2016-02-15: qty 2

## 2016-02-15 MED ORDER — METHYLERGONOVINE MALEATE 0.2 MG/ML IJ SOLN
INTRAMUSCULAR | Status: DC | PRN
  Administered 2016-02-15: 0.2 mg via INTRAMUSCULAR

## 2016-02-15 MED ORDER — FENTANYL CITRATE (PF) 100 MCG/2ML IJ SOLN
INTRAMUSCULAR | Status: DC | PRN
  Administered 2016-02-15: 100 ug via INTRAVENOUS
  Administered 2016-02-15 (×2): 50 ug via INTRAVENOUS

## 2016-02-15 MED ORDER — PROPOFOL 10 MG/ML IV BOLUS
INTRAVENOUS | Status: AC
  Filled 2016-02-15: qty 20

## 2016-02-15 MED ORDER — LIDOCAINE 2% (20 MG/ML) 5 ML SYRINGE
INTRAMUSCULAR | Status: DC | PRN
  Administered 2016-02-15: 50 mg via INTRAVENOUS

## 2016-02-15 MED ORDER — MISOPROSTOL 200 MCG PO TABS: 200.0000 ug | ORAL_TABLET | Freq: Three times a day (TID) | ORAL | 0 refills | Status: DC

## 2016-02-15 MED ORDER — PROMETHAZINE HCL 25 MG/ML IJ SOLN: 6.2500 mg | INTRAMUSCULAR | Status: DC | PRN

## 2016-02-15 MED ORDER — HYDROMORPHONE HCL 1 MG/ML IJ SOLN
0.2500 mg | INTRAMUSCULAR | Status: DC | PRN
  Administered 2016-02-15: 0.25 mg via INTRAVENOUS

## 2016-02-15 MED ORDER — KETOROLAC TROMETHAMINE 30 MG/ML IJ SOLN
INTRAMUSCULAR | Status: AC
  Filled 2016-02-15: qty 1

## 2016-02-15 MED ORDER — PROPOFOL 10 MG/ML IV BOLUS
INTRAVENOUS | Status: DC | PRN
  Administered 2016-02-15: 150 mg via INTRAVENOUS

## 2016-02-15 MED ORDER — METHYLERGONOVINE MALEATE 0.2 MG/ML IJ SOLN
INTRAMUSCULAR | Status: AC
  Filled 2016-02-15: qty 1

## 2016-02-15 MED ORDER — SCOPOLAMINE 1 MG/3DAYS TD PT72
MEDICATED_PATCH | TRANSDERMAL | Status: AC
  Administered 2016-02-15: 1.5 mg via TRANSDERMAL
  Filled 2016-02-15: qty 1

## 2016-02-15 MED ORDER — LIDOCAINE-EPINEPHRINE 1 %-1:100000 IJ SOLN
INTRAMUSCULAR | Status: AC
  Filled 2016-02-15: qty 1

## 2016-02-15 MED ORDER — LIDOCAINE HCL (CARDIAC) 20 MG/ML IV SOLN
INTRAVENOUS | Status: AC
  Filled 2016-02-15: qty 5

## 2016-02-15 MED ORDER — ONDANSETRON HCL 4 MG/2ML IJ SOLN
INTRAMUSCULAR | Status: AC
  Filled 2016-02-15: qty 2

## 2016-02-15 MED ORDER — MIDAZOLAM HCL 5 MG/5ML IJ SOLN
INTRAMUSCULAR | Status: DC | PRN
  Administered 2016-02-15: 2 mg via INTRAVENOUS

## 2016-02-15 MED ORDER — HYDROMORPHONE HCL 1 MG/ML IJ SOLN
INTRAMUSCULAR | Status: AC
  Filled 2016-02-15: qty 1

## 2016-02-15 MED ORDER — FENTANYL CITRATE (PF) 100 MCG/2ML IJ SOLN
INTRAMUSCULAR | Status: AC
Start: 2016-02-15 — End: 2016-02-15
  Filled 2016-02-15: qty 2

## 2016-02-15 MED ORDER — DEXAMETHASONE SODIUM PHOSPHATE 10 MG/ML IJ SOLN
INTRAMUSCULAR | Status: AC
  Filled 2016-02-15: qty 1

## 2016-02-15 MED ORDER — SCOPOLAMINE 1 MG/3DAYS TD PT72
1.0000 | MEDICATED_PATCH | Freq: Once | TRANSDERMAL | Status: DC
  Administered 2016-02-15: 1.5 mg via TRANSDERMAL

## 2016-02-15 MED ORDER — DEXAMETHASONE SODIUM PHOSPHATE 4 MG/ML IJ SOLN
INTRAMUSCULAR | Status: DC | PRN
  Administered 2016-02-15: 10 mg via INTRAVENOUS

## 2016-02-15 MED ORDER — LIDOCAINE-EPINEPHRINE 1 %-1:100000 IJ SOLN
INTRAMUSCULAR | Status: DC | PRN
  Administered 2016-02-15: 20 mL

## 2016-02-15 MED ORDER — LACTATED RINGERS IV SOLN
INTRAVENOUS | Status: DC
  Administered 2016-02-15 (×2): via INTRAVENOUS

## 2016-02-15 SURGICAL SUPPLY — 20 items
ADAPTER VACURETTE TBG SET 14 (CANNULA) ×3 IMPLANT
CATH ROBINSON RED A/P 16FR (CATHETERS) ×3 IMPLANT
CLOTH BEACON ORANGE TIMEOUT ST (SAFETY) ×3 IMPLANT
CONT PATH 16OZ SNAP LID 3702 (MISCELLANEOUS) ×3 IMPLANT
GLOVE BIOGEL PI IND STRL 7.0 (GLOVE) ×1 IMPLANT
GLOVE BIOGEL PI INDICATOR 7.0 (GLOVE) ×2
GLOVE SURG ORTHO 8.0 STRL STRW (GLOVE) ×6 IMPLANT
GOWN STRL REUS W/TWL LRG LVL3 (GOWN DISPOSABLE) ×6 IMPLANT
KIT BERKELEY 1ST TRIMESTER 3/8 (MISCELLANEOUS) ×3 IMPLANT
KIT BERKELEY 2ND TRIMESTER 1/2 (COLLECTOR) ×3 IMPLANT
NS IRRIG 1000ML POUR BTL (IV SOLUTION) ×3 IMPLANT
PACK VAGINAL MINOR WOMEN LF (CUSTOM PROCEDURE TRAY) ×3 IMPLANT
PAD OB MATERNITY 4.3X12.25 (PERSONAL CARE ITEMS) ×3 IMPLANT
PAD PREP 24X48 CUFFED NSTRL (MISCELLANEOUS) ×3 IMPLANT
SET BERKELEY SUCTION TUBING (SUCTIONS) ×3 IMPLANT
TOWEL OR 17X24 6PK STRL BLUE (TOWEL DISPOSABLE) ×6 IMPLANT
TUBE VACURETTE 2ND TRIMESTER (CANNULA) ×3 IMPLANT
VACURETTE 12 RIGID CVD (CANNULA) ×3 IMPLANT
VACURETTE 14MM CVD 1/2 BASE (CANNULA) ×3 IMPLANT
VACURETTE 16MM ASPIR CVD .5 (CANNULA) IMPLANT

## 2016-02-15 NOTE — Brief Op Note (Signed)
02/15/2016  2:06 PM  PATIENT:  Cheryl Stein  35 y.o. female  PRE-OPERATIVE DIAGNOSIS:  missed ab at 14 weeks  POST-OPERATIVE DIAGNOSIS:  * No post-op diagnosis entered *  PROCEDURE:  Procedure(s) with comments: DILATATION AND EVACUATION (D&E) 2ND TRIMESTER WITH GENETIC STUDIES (N/A) - ok per Chasity for this date and time  SURGEON:  Surgeon(s) and Role:    * Louretta Shorten, MD - Primary  PHYSICIAN ASSISTANT:   ASSISTANTS: none   ANESTHESIA:   general  EBL:  Total I/O In: -  Out: 475 [Urine:75; Blood:400]  BLOOD ADMINISTERED:none  DRAINS: none   LOCAL MEDICATIONS USED:  LIDOCAINE   SPECIMEN:  Source of Specimen:  POC  DISPOSITION OF SPECIMEN:  PATHOLOGY  COUNTS:  YES  TOURNIQUET:  * No tourniquets in log *  DICTATION: .Other Dictation: Dictation Number 1  PLAN OF CARE: Discharge to home after PACU  PATIENT DISPOSITION:  PACU - hemodynamically stable.   Delay start of Pharmacological VTE agent (>24hrs) due to surgical blood loss or risk of bleeding: not applicable

## 2016-02-15 NOTE — Discharge Instructions (Signed)

## 2016-02-15 NOTE — Anesthesia Preprocedure Evaluation (Signed)
Anesthesia Evaluation  Patient identified by MRN, date of birth, ID band Patient awake    Reviewed: Allergy & Precautions, NPO status , Patient's Chart, lab work & pertinent test results  Airway Mallampati: II  TM Distance: >3 FB Neck ROM: Full    Dental no notable dental hx. (+) Teeth Intact, Dental Advisory Given   Pulmonary neg pulmonary ROS,    Pulmonary exam normal breath sounds clear to auscultation       Cardiovascular negative cardio ROS Normal cardiovascular exam Rhythm:Regular Rate:Normal     Neuro/Psych negative neurological ROS  negative psych ROS   GI/Hepatic Neg liver ROS, GERD  ,  Endo/Other  negative endocrine ROS  Renal/GU negative Renal ROS  negative genitourinary   Musculoskeletal negative musculoskeletal ROS (+)   Abdominal   Peds  Hematology  (+) anemia ,   Anesthesia Other Findings   Reproductive/Obstetrics Previous C/section AMA                             Anesthesia Physical  Anesthesia Plan  ASA: II  Anesthesia Plan: General and MAC   Post-op Pain Management:    Induction:   Airway Management Planned: Natural Airway  Additional Equipment:   Intra-op Plan:   Post-operative Plan: Extubation in OR  Informed Consent: I have reviewed the patients History and Physical, chart, labs and discussed the procedure including the risks, benefits and alternatives for the proposed anesthesia with the patient or authorized representative who has indicated his/her understanding and acceptance.     Plan Discussed with: Anesthesiologist  Anesthesia Plan Comments:         Anesthesia Quick Evaluation

## 2016-02-15 NOTE — Anesthesia Postprocedure Evaluation (Signed)
Anesthesia Post Note  Patient: Cheryl Stein  Procedure(s) Performed: Procedure(s) (LRB): DILATATION AND EVACUATION (D&E) 2ND TRIMESTER WITH GENETIC STUDIES (N/A)  Patient location during evaluation: PACU Anesthesia Type: General Level of consciousness: sedated Pain management: pain level controlled Vital Signs Assessment: post-procedure vital signs reviewed and stable Respiratory status: spontaneous breathing and respiratory function stable Cardiovascular status: stable Anesthetic complications: no     Last Vitals:  Vitals:   02/15/16 1445 02/15/16 1500  BP: 108/70   Pulse: (!) 110 (!) 108  Resp: 14 16  Temp:      Last Pain:  Vitals:   02/15/16 1500  TempSrc:   PainSc: 3    Pain Goal: Patients Stated Pain Goal: 3 (02/15/16 1500)               Geraldo Haris DANIEL

## 2016-02-15 NOTE — Transfer of Care (Signed)
Immediate Anesthesia Transfer of Care Note  Patient: Cheryl Stein  Procedure(s) Performed: Procedure(s) with comments: DILATATION AND EVACUATION (D&E) 2ND TRIMESTER WITH GENETIC STUDIES (N/A) - ok per Chasity for this date and time  Patient Location: PACU  Anesthesia Type:General  Level of Consciousness: awake, alert  and oriented  Airway & Oxygen Therapy: Patient Spontanous Breathing  Post-op Assessment: Report given to RN and Post -op Vital signs reviewed and stable  Post vital signs: Reviewed and stable  Last Vitals:  Vitals:   02/15/16 1240 02/15/16 1419  BP: 110/75 131/79  Pulse: (!) 108 (!) 128  Resp: 16 14  Temp: 36.6 C 36.7 C    Last Pain:  Vitals:   02/15/16 1240  TempSrc: Oral      Patients Stated Pain Goal: 3 ([id number])  Complications: No apparent anesthesia complications

## 2016-02-17 ENCOUNTER — Encounter (HOSPITAL_COMMUNITY): Payer: Self-pay | Admitting: Obstetrics and Gynecology

## 2016-02-18 NOTE — Op Note (Signed)
NAMEEERA, BUTTER            ACCOUNT NO.:  [id number]  MEDICAL RECORD NO.:  [id number]  LOCATION:                                 FACILITY:  PHYSICIAN:  Monia Sabal. Corinna Capra, M.D.         DATE OF BIRTH:  DATE OF PROCEDURE:  02/15/2016 DATE OF DISCHARGE:                              OPERATIVE REPORT   PREOPERATIVE DIAGNOSIS:  Intrauterine pregnancy 14 weeks with embryonic demise.  POSTOPERATIVE DIAGNOSIS:  Intrauterine pregnancy 14 weeks with embryonic demise.  PROCEDURE:  Dilation and evacuation.  SURGEON:  Monia Sabal. Corinna Capra, M.D.  ANESTHESIA:  General by LMA.  INDICATIONS:  Ms. Sealock presented to my office 2 days ago for routine obstetrical care and noted no fetal heart tone followed by ultrasound showing an embryonic demise with a hydropic fetus.  She presents today for dilation and evacuation with genetic studies.  Risks and benefits were discussed.  Informed consent was obtained.  DESCRIPTION OF PROCEDURE:  After adequate analgesia, the patient was placed in dorsal lithotomy position.  She was sterilely prepped and draped.  Bladder sterilely drained.  Graves speculum was placed.  A tenaculum was placed on the anterior lip of the cervix after paracervical block was placed and 1% Xylocaine with 1:100,000 epinephrine, total 20 mL used.  Uterus was sounded to 14 cm.  It was easily dilated to a #41 Pratt dilator.  A 14 mm curette was inserted and a part of suction were retrieved using suction curettage.  After no more residual polyps being retrieved, I downsized to a 12 mm curette and __________ retrieved a small amount of clot.  Gritty surface was felt throughout the entire endometrial cavity.  The patient was given Methergine 0.2 mg IM with good uterine response.  Minimal bleeding noted.  The curette was then removed.  Tenaculum was removed from the cervix and noted to be hemostatic.  Bimanual revealed a retroverted uterus approximately 6-8 week size with minimal  bleeding noted.  The patient then transferred to recovery room in stable condition.  Sponge and instrument count was normal x3.  Estimated blood loss was 400 mL. The patient received 1 g of cefotetan preoperatively and methargen 0.2 mg IM intraoperatively.  DISPOSITION:  The patient to be discharged home.  Follow up in the office in 2-3 weeks and her other routine instruction for D and A, also prescription for Cytotec to take 200 mcg 3 times a day for 2 days.    Monia Sabal Corinna Capra, M.D.    DCL/MEDQ  D:  02/15/2016  T:  02/16/2016  Job:  [id number]

## 2016-03-07 LAB — CHROMOSOME STD, POC(TISSUE)-NCBH

## 2016-07-28 ENCOUNTER — Inpatient Hospital Stay (HOSPITAL_COMMUNITY): Admission: AD | Admit: 2016-07-28 | Payer: 59 | Source: Ambulatory Visit | Admitting: Obstetrics and Gynecology

## 2016-08-18 ENCOUNTER — Emergency Department (HOSPITAL_COMMUNITY)
Admission: EM | Admit: 2016-08-18 | Discharge: 2016-08-18 | Disposition: A | Discharge status: Home / Self Care | Payer: 59 | Attending: Emergency Medicine | Admitting: Emergency Medicine

## 2016-08-18 ENCOUNTER — Emergency Department (HOSPITAL_COMMUNITY): Payer: 59 | Admitting: Certified Registered Nurse Anesthetist

## 2016-08-18 ENCOUNTER — Emergency Department (HOSPITAL_COMMUNITY): Payer: 59

## 2016-08-18 ENCOUNTER — Encounter (HOSPITAL_COMMUNITY): Payer: Self-pay | Admitting: Emergency Medicine

## 2016-08-18 ENCOUNTER — Encounter (HOSPITAL_COMMUNITY)
Admission: EM | Disposition: A | Discharge status: Home / Self Care | Payer: Self-pay | Source: Home / Self Care | Attending: Emergency Medicine

## 2016-08-18 ENCOUNTER — Inpatient Hospital Stay: Admit: 2016-08-18 | Payer: 59 | Admitting: Surgery

## 2016-08-18 DIAGNOSIS — K219 Gastro-esophageal reflux disease without esophagitis: Secondary | ICD-10-CM | POA: Diagnosis not present

## 2016-08-18 DIAGNOSIS — D688 Other specified coagulation defects: Secondary | ICD-10-CM | POA: Diagnosis not present

## 2016-08-18 DIAGNOSIS — R1013 Epigastric pain: Secondary | ICD-10-CM

## 2016-08-18 DIAGNOSIS — K828 Other specified diseases of gallbladder: Secondary | ICD-10-CM | POA: Diagnosis not present

## 2016-08-18 DIAGNOSIS — Z8719 Personal history of other diseases of the digestive system: Secondary | ICD-10-CM | POA: Insufficient documentation

## 2016-08-18 DIAGNOSIS — Z885 Allergy status to narcotic agent status: Secondary | ICD-10-CM | POA: Diagnosis not present

## 2016-08-18 DIAGNOSIS — Z7982 Long term (current) use of aspirin: Secondary | ICD-10-CM | POA: Insufficient documentation

## 2016-08-18 DIAGNOSIS — K8010 Calculus of gallbladder with chronic cholecystitis without obstruction: Secondary | ICD-10-CM | POA: Insufficient documentation

## 2016-08-18 DIAGNOSIS — Z8249 Family history of ischemic heart disease and other diseases of the circulatory system: Secondary | ICD-10-CM | POA: Insufficient documentation

## 2016-08-18 DIAGNOSIS — Z833 Family history of diabetes mellitus: Secondary | ICD-10-CM | POA: Insufficient documentation

## 2016-08-18 DIAGNOSIS — Z820 Family history of epilepsy and other diseases of the nervous system: Secondary | ICD-10-CM | POA: Insufficient documentation

## 2016-08-18 DIAGNOSIS — Z79899 Other long term (current) drug therapy: Secondary | ICD-10-CM | POA: Diagnosis not present

## 2016-08-18 DIAGNOSIS — Z91040 Latex allergy status: Secondary | ICD-10-CM | POA: Diagnosis not present

## 2016-08-18 DIAGNOSIS — Z8711 Personal history of peptic ulcer disease: Secondary | ICD-10-CM | POA: Diagnosis not present

## 2016-08-18 DIAGNOSIS — K8020 Calculus of gallbladder without cholecystitis without obstruction: Secondary | ICD-10-CM

## 2016-08-18 DIAGNOSIS — Z8489 Family history of other specified conditions: Secondary | ICD-10-CM | POA: Insufficient documentation

## 2016-08-18 DIAGNOSIS — Z419 Encounter for procedure for purposes other than remedying health state, unspecified: Secondary | ICD-10-CM

## 2016-08-18 HISTORY — PX: CHOLECYSTECTOMY: SHX55

## 2016-08-18 LAB — CBC
HCT: 42.5 % (ref 36.0–46.0)
Hemoglobin: 14.8 g/dL (ref 12.0–15.0)
MCH: 29.6 pg (ref 26.0–34.0)
MCHC: 34.8 g/dL (ref 30.0–36.0)
MCV: 85 fL (ref 78.0–100.0)
Platelets: 266 10*3/uL (ref 150–400)
RBC: 5 MIL/uL (ref 3.87–5.11)
RDW: 12.5 % (ref 11.5–15.5)
WBC: 13.3 10*3/uL — AB (ref 4.0–10.5)

## 2016-08-18 LAB — URINALYSIS, ROUTINE W REFLEX MICROSCOPIC
BACTERIA UA: NONE SEEN
BILIRUBIN URINE: NEGATIVE
Glucose, UA: NEGATIVE mg/dL
Hgb urine dipstick: NEGATIVE
KETONES UR: 80 mg/dL — AB
LEUKOCYTES UA: NEGATIVE
Nitrite: NEGATIVE
Protein, ur: 30 mg/dL — AB
SPECIFIC GRAVITY, URINE: 1.03 (ref 1.005–1.030)
pH: 7 (ref 5.0–8.0)

## 2016-08-18 LAB — COMPREHENSIVE METABOLIC PANEL
ALT: 15 U/L (ref 14–54)
AST: 23 U/L (ref 15–41)
Albumin: 4.3 g/dL (ref 3.5–5.0)
Alkaline Phosphatase: 47 U/L (ref 38–126)
Anion gap: 9 (ref 5–15)
BUN: 19 mg/dL (ref 6–20)
CHLORIDE: 105 mmol/L (ref 101–111)
CO2: 24 mmol/L (ref 22–32)
Calcium: 9 mg/dL (ref 8.9–10.3)
Creatinine, Ser: 0.69 mg/dL (ref 0.44–1.00)
GFR calc Af Amer: >60 mL/min (ref >60)
GLUCOSE: 124 mg/dL — AB (ref 65–99)
Potassium: 3.9 mmol/L (ref 3.5–5.1)
Sodium: 138 mmol/L (ref 135–145)
Total Bilirubin: 1.5 mg/dL — ABNORMAL HIGH (ref 0.3–1.2)
Total Protein: 7.5 g/dL (ref 6.5–8.1)

## 2016-08-18 LAB — LIPASE, BLOOD: LIPASE: 22 U/L (ref 11–51)

## 2016-08-18 LAB — I-STAT BETA HCG BLOOD, ED (MC, WL, AP ONLY)

## 2016-08-18 SURGERY — LAPAROSCOPIC CHOLECYSTECTOMY WITH INTRAOPERATIVE CHOLANGIOGRAM
Anesthesia: General

## 2016-08-18 MED ORDER — FENTANYL CITRATE (PF) 100 MCG/2ML IJ SOLN
INTRAMUSCULAR | Status: DC | PRN
  Administered 2016-08-18: 100 ug via INTRAVENOUS
  Administered 2016-08-18 (×2): 50 ug via INTRAVENOUS

## 2016-08-18 MED ORDER — DEXAMETHASONE SODIUM PHOSPHATE 10 MG/ML IJ SOLN
INTRAMUSCULAR | Status: DC | PRN
  Administered 2016-08-18: 10 mg via INTRAVENOUS

## 2016-08-18 MED ORDER — PROPOFOL 10 MG/ML IV BOLUS
INTRAVENOUS | Status: AC
  Filled 2016-08-18: qty 20

## 2016-08-18 MED ORDER — CEFAZOLIN SODIUM-DEXTROSE 2-3 GM-% IV SOLR
INTRAVENOUS | Status: DC | PRN
  Administered 2016-08-18: 2 g via INTRAVENOUS

## 2016-08-18 MED ORDER — LACTATED RINGERS IV SOLN: INTRAVENOUS | Status: DC

## 2016-08-18 MED ORDER — SUGAMMADEX SODIUM 200 MG/2ML IV SOLN
INTRAVENOUS | Status: AC
  Filled 2016-08-18: qty 2

## 2016-08-18 MED ORDER — ONDANSETRON HCL 4 MG/2ML IJ SOLN
4.0000 mg | Freq: Once | INTRAMUSCULAR | Status: AC
  Administered 2016-08-18: 4 mg via INTRAVENOUS
  Filled 2016-08-18: qty 2

## 2016-08-18 MED ORDER — ONDANSETRON 4 MG PO TBDP: 4.0000 mg | ORAL_TABLET | Freq: Three times a day (TID) | ORAL | 0 refills | Status: DC | PRN

## 2016-08-18 MED ORDER — HYDROCODONE-ACETAMINOPHEN 5-325 MG PO TABS: 1.0000 | ORAL_TABLET | Freq: Four times a day (QID) | ORAL | 0 refills | Status: DC | PRN

## 2016-08-18 MED ORDER — MIDAZOLAM HCL 2 MG/2ML IJ SOLN
INTRAMUSCULAR | Status: AC
  Filled 2016-08-18: qty 2

## 2016-08-18 MED ORDER — CEFAZOLIN SODIUM-DEXTROSE 2-4 GM/100ML-% IV SOLN
INTRAVENOUS | Status: AC
  Filled 2016-08-18: qty 100

## 2016-08-18 MED ORDER — SODIUM CHLORIDE 0.9 % IV BOLUS (SEPSIS)
1000.0000 mL | Freq: Once | INTRAVENOUS | Status: AC
  Administered 2016-08-18: 1000 mL via INTRAVENOUS

## 2016-08-18 MED ORDER — METOCLOPRAMIDE HCL 5 MG/ML IJ SOLN
INTRAMUSCULAR | Status: DC | PRN
  Administered 2016-08-18: 5 mg via INTRAVENOUS

## 2016-08-18 MED ORDER — MIDAZOLAM HCL 5 MG/5ML IJ SOLN
INTRAMUSCULAR | Status: DC | PRN
  Administered 2016-08-18 (×2): 1 mg via INTRAVENOUS

## 2016-08-18 MED ORDER — BUPIVACAINE-EPINEPHRINE (PF) 0.25% -1:200000 IJ SOLN
INTRAMUSCULAR | Status: AC
  Filled 2016-08-18: qty 30

## 2016-08-18 MED ORDER — SCOPOLAMINE 1 MG/3DAYS TD PT72
MEDICATED_PATCH | TRANSDERMAL | Status: DC | PRN
  Administered 2016-08-18: 1 via TRANSDERMAL

## 2016-08-18 MED ORDER — ONDANSETRON HCL 4 MG/2ML IJ SOLN
INTRAMUSCULAR | Status: AC
  Filled 2016-08-18: qty 2

## 2016-08-18 MED ORDER — SUGAMMADEX SODIUM 500 MG/5ML IV SOLN
INTRAVENOUS | Status: DC | PRN
  Administered 2016-08-18: 200 mg via INTRAVENOUS

## 2016-08-18 MED ORDER — FENTANYL CITRATE (PF) 100 MCG/2ML IJ SOLN
50.0000 ug | Freq: Once | INTRAMUSCULAR | Status: AC
  Administered 2016-08-18: 50 ug via INTRAVENOUS
  Filled 2016-08-18: qty 2

## 2016-08-18 MED ORDER — LIDOCAINE 2% (20 MG/ML) 5 ML SYRINGE
INTRAMUSCULAR | Status: AC
  Filled 2016-08-18: qty 5

## 2016-08-18 MED ORDER — SUCCINYLCHOLINE CHLORIDE 20 MG/ML IJ SOLN
INTRAMUSCULAR | Status: DC | PRN
  Administered 2016-08-18: 120 mg via INTRAVENOUS

## 2016-08-18 MED ORDER — ROCURONIUM BROMIDE 50 MG/5ML IV SOSY
PREFILLED_SYRINGE | INTRAVENOUS | Status: AC
  Filled 2016-08-18: qty 5

## 2016-08-18 MED ORDER — DEXAMETHASONE SODIUM PHOSPHATE 10 MG/ML IJ SOLN
INTRAMUSCULAR | Status: AC
  Filled 2016-08-18: qty 1

## 2016-08-18 MED ORDER — METOCLOPRAMIDE HCL 5 MG/ML IJ SOLN
INTRAMUSCULAR | Status: AC
  Filled 2016-08-18: qty 2

## 2016-08-18 MED ORDER — LACTATED RINGERS IR SOLN
Status: DC | PRN
  Administered 2016-08-18: 2000 mL

## 2016-08-18 MED ORDER — SCOPOLAMINE 1 MG/3DAYS TD PT72
MEDICATED_PATCH | TRANSDERMAL | Status: AC
  Filled 2016-08-18: qty 1

## 2016-08-18 MED ORDER — HYDROMORPHONE HCL 1 MG/ML IJ SOLN
INTRAMUSCULAR | Status: AC
  Filled 2016-08-18: qty 1

## 2016-08-18 MED ORDER — PROMETHAZINE HCL 25 MG/ML IJ SOLN: 6.2500 mg | INTRAMUSCULAR | Status: DC | PRN

## 2016-08-18 MED ORDER — MEPERIDINE HCL 50 MG/ML IJ SOLN: 6.2500 mg | INTRAMUSCULAR | Status: DC | PRN

## 2016-08-18 MED ORDER — IOPAMIDOL (ISOVUE-300) INJECTION 61%
INTRAVENOUS | Status: DC | PRN
  Administered 2016-08-18: 4 mL

## 2016-08-18 MED ORDER — ROCURONIUM BROMIDE 100 MG/10ML IV SOLN
INTRAVENOUS | Status: DC | PRN
  Administered 2016-08-18: 5 mg via INTRAVENOUS
  Administered 2016-08-18: 30 mg via INTRAVENOUS
  Administered 2016-08-18: 10 mg via INTRAVENOUS

## 2016-08-18 MED ORDER — PROPOFOL 10 MG/ML IV BOLUS
INTRAVENOUS | Status: DC | PRN
Start: 2016-08-18 — End: 2016-08-18
  Administered 2016-08-18: 150 mg via INTRAVENOUS

## 2016-08-18 MED ORDER — FENTANYL CITRATE (PF) 100 MCG/2ML IJ SOLN
INTRAMUSCULAR | Status: AC
  Filled 2016-08-18: qty 2

## 2016-08-18 MED ORDER — ONDANSETRON HCL 4 MG/2ML IJ SOLN
INTRAMUSCULAR | Status: DC | PRN
  Administered 2016-08-18: 4 mg via INTRAVENOUS

## 2016-08-18 MED ORDER — BUPIVACAINE-EPINEPHRINE 0.25% -1:200000 IJ SOLN
INTRAMUSCULAR | Status: DC | PRN
  Administered 2016-08-18: 30 mL

## 2016-08-18 MED ORDER — ONDANSETRON 4 MG PO TBDP
4.0000 mg | ORAL_TABLET | Freq: Once | ORAL | Status: AC
  Administered 2016-08-18: 4 mg via ORAL
  Filled 2016-08-18: qty 1

## 2016-08-18 MED ORDER — HYDROMORPHONE HCL 1 MG/ML IJ SOLN
0.2500 mg | INTRAMUSCULAR | Status: DC | PRN
  Administered 2016-08-18 (×4): 0.25 mg via INTRAVENOUS

## 2016-08-18 MED ORDER — IOPAMIDOL (ISOVUE-300) INJECTION 61%
INTRAVENOUS | Status: AC
  Filled 2016-08-18: qty 50

## 2016-08-18 MED ORDER — LIDOCAINE HCL (CARDIAC) 20 MG/ML IV SOLN
INTRAVENOUS | Status: DC | PRN
  Administered 2016-08-18: 80 mg via INTRAVENOUS

## 2016-08-18 MED ORDER — LACTATED RINGERS IV SOLN
INTRAVENOUS | Status: DC | PRN
  Administered 2016-08-18 (×2): via INTRAVENOUS

## 2016-08-18 SURGICAL SUPPLY — 41 items
APPLIER CLIP 5 13 M/L LIGAMAX5 (MISCELLANEOUS) ×3
APPLIER CLIP ROT 10 11.4 M/L (STAPLE)
BENZOIN TINCTURE PRP APPL 2/3 (GAUZE/BANDAGES/DRESSINGS) IMPLANT
CABLE HIGH FREQUENCY MONO STRZ (ELECTRODE) ×3 IMPLANT
CHLORAPREP W/TINT 26ML (MISCELLANEOUS) ×3 IMPLANT
CHOLANGIOGRAM CATH TAUT (CATHETERS) ×3 IMPLANT
CLIP APPLIE 5 13 M/L LIGAMAX5 (MISCELLANEOUS) ×1 IMPLANT
CLIP APPLIE ROT 10 11.4 M/L (STAPLE) IMPLANT
CLOSURE WOUND 1/4X4 (GAUZE/BANDAGES/DRESSINGS)
COVER MAYO STAND STRL (DRAPES) ×3 IMPLANT
COVER SURGICAL LIGHT HANDLE (MISCELLANEOUS) ×3 IMPLANT
DECANTER SPIKE VIAL GLASS SM (MISCELLANEOUS) IMPLANT
DERMABOND ADVANCED (GAUZE/BANDAGES/DRESSINGS) ×2
DERMABOND ADVANCED .7 DNX12 (GAUZE/BANDAGES/DRESSINGS) ×1 IMPLANT
DRAPE C-ARM 42X120 X-RAY (DRAPES) ×3 IMPLANT
ELECT PENCIL ROCKER SW 15FT (MISCELLANEOUS) ×3 IMPLANT
ELECT REM PT RETURN 15FT ADLT (MISCELLANEOUS) ×3 IMPLANT
GLOVE SURG SIGNA 7.5 PF LTX (GLOVE) IMPLANT
GOWN STRL REUS W/TWL XL LVL3 (GOWN DISPOSABLE) ×9 IMPLANT
HEMOSTAT SURGICEL 4X8 (HEMOSTASIS) IMPLANT
IRRIG SUCT STRYKERFLOW 2 WTIP (MISCELLANEOUS) ×3
IRRIGATION SUCT STRKRFLW 2 WTP (MISCELLANEOUS) ×1 IMPLANT
IV CATH 14GX2 1/4 (CATHETERS) ×3 IMPLANT
IV SET EXTENSION CATH 6 NF (IV SETS) ×3 IMPLANT
KIT BASIN OR (CUSTOM PROCEDURE TRAY) ×3 IMPLANT
L-HOOK LAP DISP 36CM (ELECTROSURGICAL) ×3
LHOOK LAP DISP 36CM (ELECTROSURGICAL) ×1 IMPLANT
POUCH SPECIMEN RETRIEVAL 10MM (ENDOMECHANICALS) ×3 IMPLANT
SCISSORS LAP 5X35 DISP (ENDOMECHANICALS) ×3 IMPLANT
SLEEVE ADV FIXATION 5X100MM (TROCAR) ×3 IMPLANT
STOPCOCK 4 WAY LG BORE MALE ST (IV SETS) ×3 IMPLANT
STRIP CLOSURE SKIN 1/4X4 (GAUZE/BANDAGES/DRESSINGS) IMPLANT
SUT MNCRL AB 4-0 PS2 18 (SUTURE) ×6 IMPLANT
SYR 10ML ECCENTRIC (SYRINGE) ×3 IMPLANT
TOWEL OR 17X26 10 PK STRL BLUE (TOWEL DISPOSABLE) ×3 IMPLANT
TOWEL OR NON WOVEN STRL DISP B (DISPOSABLE) ×3 IMPLANT
TRAY LAPAROSCOPIC (CUSTOM PROCEDURE TRAY) ×3 IMPLANT
TROCAR ADV FIXATION 11X100MM (TROCAR) IMPLANT
TROCAR ADV FIXATION 5X100MM (TROCAR) ×3 IMPLANT
TROCAR XCEL BLUNT TIP 100MML (ENDOMECHANICALS) ×3 IMPLANT
TUBING INSUF HEATED (TUBING) ×3 IMPLANT

## 2016-08-18 NOTE — ED Provider Notes (Signed)
[facility] DEPT Provider Note   CSN: [id number] Arrival date & time: 08/18/16  0458     History   Chief Complaint Chief Complaint  Patient presents with  . Abdominal Pain    HPI Cheryl Stein is a 36 y.o. female.  Pt w PMH GERD, PUD, endometriosis, presents w acute onset of sharp intermittent epigastric abdominal pain that began yesterday evening around 1900. Pt reports pain does not radiate to her back or shoulder, and is localized in epigastric region down to umbilicus. Pt is not made worse by movement, however is better when laying flat. Pt states she initially began feeling nauseas after eating dinner, then began vomiting around 2100 w intermittent abd pains. Pt states she has vomited 3 times since last night, with last episode around 0200. She has also had mild diarrhea over night. Pt denies hematemesis, melena, urinary sx, CP, SOB, fever, vaginal bleeding or discharge. Reports she has had multiple laparoscopic surgeries for her endometriosis. Denies hx bowel obstruction. States she is currently breastfeeding.       Past Medical History:  Diagnosis Date  . AMA (advanced maternal age) multigravida 35+   . Anemia    periodic  . Endometriosis   . Family history of adverse reaction to anesthesia    mother has PONV  . GERD (gastroesophageal reflux disease)   . History of multiple miscarriages   . Hx of varicella   . Low blood pressure reading     Patient Active Problem List   Diagnosis Date Noted  . Encounter for trial of labor 09/18/2014    Past Surgical History:  Procedure Laterality Date  . CESAREAN SECTION  12/21/2010   Procedure: CESAREAN SECTION;  Surgeon: Luz Lex, MD;  Location: [facility];  Service: Gynecology;  Laterality: N/A;  Primary Cesarean Section with birth of baby boy @ 62  . DILATION AND EVACUATION    . DILATION AND EVACUATION N/A 02/15/2016   Procedure: DILATATION AND EVACUATION (D&E) 2ND TRIMESTER WITH GENETIC STUDIES;  Surgeon: Louretta Shorten, MD;  Location: [facility];  Service: Gynecology;  Laterality: N/A;  ok per Chasity for this date and time  . HYMENECTOMY    . LAPAROSCOPY    . WISDOM TOOTH EXTRACTION      OB History    Gravida Para Term Preterm AB Living   6 2 2  0 3 2   SAB TAB Ectopic Multiple Live Births   3 0 0 0 2       Home Medications    Prior to Admission medications   Medication Sig Start Date End Date Taking? Authorizing Provider  aspirin 81 MG chewable tablet Chew 81 mg by mouth daily.   Yes [provider]  EVENING PRIMROSE OIL PO Take 2 capsules by mouth daily.   Yes [provider]  Prenatal Vit-Fe Fumarate-FA (PRENATAL MULTIVITAMIN) TABS tablet Take 1 tablet by mouth daily at 12 noon.   Yes [provider]  ranitidine (ZANTAC) 150 MG tablet Take 150 mg by mouth as needed for heartburn.   Yes [provider]  misoprostol (CYTOTEC) 200 MCG tablet Take 1 tablet (200 mcg total) by mouth 3 (three) times daily. 02/15/16 02/17/16  Louretta Shorten, MD    Family History Family History  Problem Relation Age of Onset  . Hypertension Mother   . Parkinson's disease Father   . Birth defects Brother        anacephaly  . Diabetes Maternal Grandfather   . Diabetes Paternal Grandmother  Social History Social History  Substance Use Topics  . Smoking status: Never Smoker  . Smokeless tobacco: Never Used  . Alcohol use Yes     Comment: occ     Allergies   Codeine and Latex   Review of Systems Review of Systems  Constitutional: Negative for fever.  HENT: Negative for trouble swallowing.   Respiratory: Negative for shortness of breath.   Cardiovascular: Negative for chest pain.  Gastrointestinal: Positive for abdominal pain (epigastric), diarrhea, nausea and vomiting. Negative for blood in stool and constipation.  Genitourinary: Negative for dysuria, frequency, vaginal bleeding and vaginal discharge.  Musculoskeletal: Negative for back pain.  Skin: Negative for  color change.  Allergic/Immunologic: Negative for immunocompromised state.  Neurological: Negative for headaches.     Physical Exam Updated Vital Signs BP 107/74 (BP Location: Left Arm)   Pulse 100   Temp 97.6 F (36.4 C) (Oral)   Resp 18   LMP 08/10/2016 (Exact Date)   SpO2 99%   Breastfeeding? Unknown   Physical Exam  Constitutional: She appears well-developed and well-nourished. No distress.  HENT:  Head: Normocephalic and atraumatic.  Eyes: Conjunctivae are normal.  Neck: Normal range of motion.  Cardiovascular: Regular rhythm, normal heart sounds and intact distal pulses.  Exam reveals no gallop and no friction rub.   No murmur heard. Pulmonary/Chest: Effort normal and breath sounds normal. No respiratory distress. She has no wheezes. She has no rales.  Abdominal: Soft. Normal appearance and bowel sounds are normal. She exhibits no distension and no mass. There is no tenderness. There is no rebound, no guarding, no CVA tenderness, no tenderness at McBurney's point and negative Murphy's sign.  Neurological: She is alert.  Skin: Skin is warm. She is not diaphoretic.  Psychiatric: She has a normal mood and affect. Her behavior is normal.  Nursing note and vitals reviewed.    ED Treatments / Results  Labs (all labs ordered are listed, but only abnormal results are displayed) Labs Reviewed  COMPREHENSIVE METABOLIC PANEL - Abnormal; Notable for the following:       Result Value   Glucose, Bld 124 (*)    Total Bilirubin 1.5 (*)    All other components within normal limits  CBC - Abnormal; Notable for the following:    WBC 13.3 (*)    All other components within normal limits  URINALYSIS, ROUTINE W REFLEX MICROSCOPIC - Abnormal; Notable for the following:    Ketones, ur 80 (*)    Protein, ur 30 (*)    Squamous Epithelial / LPF 0-5 (*)    All other components within normal limits  LIPASE, BLOOD  I-STAT BETA HCG BLOOD, ED ([facility], [facility], [facility] ONLY)    EKG  EKG  Interpretation None       Radiology US Abdomen Limited Ruq  Result Date: 08/18/2016 CLINICAL DATA:  Epigastric pain EXAM: ULTRASOUND ABDOMEN LIMITED RIGHT UPPER QUADRANT COMPARISON:  None. FINDINGS: Gallbladder: Multiple small gallstones measuring up to 7.6 mm. Gallbladder wall thickness is normal 1.2 mm. Negative sonographic Murphy sign Common bile duct: Diameter: 3.5 mm Liver: No focal lesion identified. Within normal limits in parenchymal echogenicity. IMPRESSION: Cholelithiasis without evidence of cholecystitis. Electronically Signed   By: Franchot Gallo M.D.   On: 08/18/2016 07:28    Procedures Procedures (including critical care time)  Medications Ordered in ED Medications  ondansetron (ZOFRAN-ODT) disintegrating tablet 4 mg (4 mg Oral Given 08/18/16 0631)  sodium chloride 0.9 % bolus 1,000 mL (1,000 mLs Intravenous New Bag/Given 08/18/16  0641)  fentaNYL (SUBLIMAZE) injection 50 mcg (50 mcg Intravenous Given 08/18/16 0710)     Initial Impression / Assessment and Plan / ED Course  I have reviewed the triage vital signs and the nursing notes.  Pertinent labs & imaging results that were available during my care of the patient were reviewed by me and considered in my medical decision making (see chart for details).  Clinical Course as of Aug 18 900  Mon Aug 18, 2016  2395 Spoke with Theodis Sato, w [facility], who is accepting pt.  [JR]    Clinical Course User Index [JR] Russo, [place] N, PA-C    Pt w abdominal pain and vomiting since last night. WBC slightly elevated, bilirubin slightly elevated, VSS. RUQ ultrasound w cholelithiasis, no evidence of cholecystitis. On initial exam, pt without abd tenderness, however on repeat exam, pt w RUQ tenderness. Pt is breastfeeding. Symptoms managed in ED w fentanyl, zofran. IVF given. Pt given option for surgery consult in ED vs outpatient follow up. Pt elected for consult in ED. Spoke w Creig Hines, PA-C w general surgery, who  is accepting patient.   Patient discussed with and seen by Dr. Kathrynn Humble.  The patient appears reasonably stabilized for admission considering the current resources, flow, and capabilities available in the ED at this time, and I doubt any other [facility] requiring further screening and/or treatment in the ED prior to admission.    Final Clinical Impressions(s) / ED Diagnoses   Final diagnoses:  Epigastric abdominal pain  Symptomatic cholelithiasis    New Prescriptions New Prescriptions   No medications on file     Russo, [place] N, PA-C 08/18/16 [place], Ankit, MD 08/19/16 2310

## 2016-08-18 NOTE — Transfer of Care (Signed)
Immediate Anesthesia Transfer of Care Note  Patient: Cheryl Stein  Procedure(s) Performed: Procedure(s): LAPAROSCOPIC CHOLECYSTECTOMY WITH INTRAOPERATIVE CHOLANGIOGRAM (N/A)  Patient Location: PACU  Anesthesia Type:General  Level of Consciousness: awake, oriented, drowsy, patient cooperative, lethargic and responds to stimulation  Airway & Oxygen Therapy: Patient Spontanous Breathing and Patient connected to face mask oxygen  Post-op Assessment: Report given to RN, Post -op Vital signs reviewed and stable and Patient moving all extremities  Post vital signs: Reviewed and stable  Last Vitals:  Vitals:   08/18/16 0636 08/18/16 0921  BP: 107/74 104/70  Pulse: 100 100  Resp: 18 18  Temp:      Last Pain:  Vitals:   08/18/16 0921  TempSrc:   PainSc: 2          Complications: No apparent anesthesia complications

## 2016-08-18 NOTE — Discharge Instructions (Signed)
[facility] - DISCHARGE INSTRUCTIONS TO PATIENT  Activity:  Driving - May drive in 2 or 3 days, if doing well   Lifting - No lifting more than 15 pounds for 7 days  Wound Care:   May shower in 2 days  Diet:  As tolerated, eat light the a day or two  Follow up appointment:  Call Dr. Pollie Friar office [facility]) at [phone] for an appointment in 2 - 3 weeks.  Medications and dosages:  Resume your home medications.  You have a prescription for:  Vicodin and Zofran  Call Dr. Lucia Gaskins or his office  [phone]) if you have:  Temperature greater than 100.4,  Persistent nausea and vomiting,  Severe uncontrolled pain,  Redness, tenderness, or signs of infection (pain, swelling, redness, odor or green/yellow discharge around the site),  Difficulty breathing, headache or visual disturbances,  Any other questions or concerns you may have after discharge.  In an emergency, call 911 or go to an Emergency Department at a nearby hospital.

## 2016-08-18 NOTE — H&P (Signed)
Re:   Cheryl Stein DOB:   07-Aug-1980 MRN:   [id number]   Cheryl Stein General Surgery Admission  Chief Complaint Abdominal pain  ASSESEMENT AND PLAN: 1.  Gall bladder disease  I discussed with the patient the indications and risks of gall bladder surgery.  The primary risks of gall bladder surgery include, but are not limited to, bleeding, infection, common bile duct injury, and open surgery.  There is also the risk that the patient may have continued symptoms after surgery.  We discussed the typical post-operative recovery course. I tried to answer the patient's questions.  I gave the patient literature about gall bladder surgery.  2.  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  History of endometriosis  Laparoscopy by Dr. Daron Offer - 2012 and 2014 4.  Possible history of "stomach ulcer"   Chief Complaint  Patient presents with  . Abdominal Pain   PHYSICIAN REQUESTING CONSULTATION: Kathyrn Lass, MD  HISTORY OF PRESENT ILLNESS: Cheryl Stein is a 36 y.o. (DOB: 1980-05-01)  white female whose primary care physician is Kathyrn Lass, MD and comes to [facility] today for abdominal pain. Her husband, Shanon Brow, is with her.  She became nauseated last PM around 7.  She then developed epigastric pain around 9 PM.  She vomited at least 3 times, continued to have pain, and came to the [facility].  She's had no prior attack like this. She has no been jaundiced or run a fever.  She has a remote history of "stomach ulcer", but this was made clinically.  She has not had an upper endoscopy. She has had endometriosis - had 2 laparoscopies for endometriosis by Dr. Corinna Capra - 2012 and 2014.  She has pain with her periods, but this does not feel like that. She has no liver, pancreas, or colon disease.  [place] of abdomen - Cholelithiasis without evidence of cholecystitis. WBC - 13,300 - 08/18/2016 T. Bili - 1.5 - 08/12/2016    Past Medical History:  Diagnosis Date  . AMA (advanced maternal age)  multigravida 45+   . Anemia    periodic  . Endometriosis   . Family history of adverse reaction to anesthesia    mother has PONV  . GERD (gastroesophageal reflux disease)   . History of multiple miscarriages   . Hx of varicella   . Low blood pressure reading       Past Surgical History:  Procedure Laterality Date  . CESAREAN SECTION  12/21/2010   Procedure: CESAREAN SECTION;  Surgeon: Luz Lex, MD;  Location: [facility];  Service: Gynecology;  Laterality: N/A;  Primary Cesarean Section with birth of baby boy @ 26  . DILATION AND EVACUATION    . DILATION AND EVACUATION N/A 02/15/2016   Procedure: DILATATION AND EVACUATION (D&E) 2ND TRIMESTER WITH GENETIC STUDIES;  Surgeon: Louretta Shorten, MD;  Location: [facility];  Service: Gynecology;  Laterality: N/A;  ok per [place] for this date and time  . HYMENECTOMY    . LAPAROSCOPY    . WISDOM TOOTH EXTRACTION        No current facility-administered medications for this encounter.    Current Outpatient Prescriptions  Medication Sig Dispense Refill  . aspirin 81 MG chewable tablet Chew 81 mg by mouth daily.    Marland Kitchen EVENING PRIMROSE OIL PO Take 2 capsules by mouth daily.    . Prenatal Vit-Fe Fumarate-FA (PRENATAL MULTIVITAMIN) TABS tablet Take 1 tablet by mouth daily at 12 noon.    Marland Kitchen  ranitidine (ZANTAC) 150 MG tablet Take 150 mg by mouth as needed for heartburn.    . misoprostol (CYTOTEC) 200 MCG tablet Take 1 tablet (200 mcg total) by mouth 3 (three) times daily. 6 tablet 0      Allergies  Allergen Reactions  . Codeine Nausea And Vomiting  . Latex Rash    REVIEW OF SYSTEMS: Skin:  No history of rash.  No history of abnormal moles. Infection:  No history of hepatitis or HIV.  No history of MRSA. Neurologic:  No history of stroke.  No history of seizure.  No history of headaches. Cardiac:  No history of hypertension. No history of heart disease.   Pulmonary:  Does not smoke cigarettes.  No asthma or bronchitis.  No OSA/CPAP.  Endocrine:   No diabetes. No thyroid disease. Gastrointestinal:  See HPI Urologic:  No history of kidney stones.  No history of bladder infections. Musculoskeletal:  No history of joint or back disease. 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  She said that if she gets pregnant again, they would put her on heparin shots.  On baby aspirin. GYN:  History of endometriosis.  She's had 6 miscarriages. Psycho-social:  The patient is oriented.   The patient has no obvious psychologic or social impairment to understanding our conversation and plan.  SOCIAL and FAMILY HISTORY: Married.  Husband, Shanon Brow, in room. Has 2 children - 16 yo son, and 72 yo daughter No working. Husband works at [employer] history of gall bladder disease - her grandmother had gall bladder disease.Marland Kitchen  PHYSICAL EXAM: BP 104/70 (BP Location: Left Arm)   Pulse 100   Temp 97.6 F (36.4 C) (Oral)   Resp 18   LMP 08/10/2016 (Exact Date)   SpO2 100%   Breastfeeding? Unknown   General: WN WF who is alert and generally healthy appearing.  Skin:  Inspection and palpation - no mass or rash. Eyes:  Conjunctiva and lids unremarkable.            Pupils are equal Ears, Nose, Mouth, and Throat:  Ears and nose unremarkable            Lips and teeth are unremarable. Neck: Supple. No mass, trachea midline.  No thyroid mass. Lymph Nodes:  No supraclavicular, cervical, or inguinal nodes. Lungs: Normal respiratory effort.  Clear to auscultation and symmetric breath sounds. Heart:  Palpation of the heart is normal.            Auscultation: RRR. No murmur or rub.  Abdomen: Soft. No mass.  No hernia.             Normal bowel sounds.  Infraumbilical scar.  A little sore in her epigastrium. Rectal: Not done. Musculoskeletal:   Good muscle strength and ROM  in upper and lower extremities. Neurologic:  Grossly intact to motor and sensory function. Psychiatric:  Normal judgement and insight. Behavior is normal.            Oriented to time, person, place.   DATA REVIEWED, COUNSELING AND COORDINATION OF CARE: Epic notes reviewed. Counseling and coordination of care exceeded more than 50% of the time spent with patient. Total time spent with patient and charting: 45 minutes.  Alphonsa Overall, MD,  [facility], [address]  [place], [place]    [place] Phone:  [phone] FAX:  [phone]

## 2016-08-18 NOTE — ED Triage Notes (Signed)
Pt is c/o nausea, vomiting and abd pain that started about 7pm on Sunday  Pt states she has had 3 episodes of vomiting  Pt states the pain is mainly above the umbilicus and is burning and stabbing and comes and goes

## 2016-08-18 NOTE — Anesthesia Postprocedure Evaluation (Signed)
Anesthesia Post Note  Patient: Cheryl Stein  Procedure(s) Performed: Procedure(s) (LRB): LAPAROSCOPIC CHOLECYSTECTOMY WITH INTRAOPERATIVE CHOLANGIOGRAM (N/A)     Patient location during evaluation: PACU Anesthesia Type: General Level of consciousness: awake and alert Pain management: pain level controlled Vital Signs Assessment: post-procedure vital signs reviewed and stable Respiratory status: spontaneous breathing, nonlabored ventilation, respiratory function stable and patient connected to nasal cannula oxygen Cardiovascular status: blood pressure returned to baseline and stable Postop Assessment: no signs of nausea or vomiting Anesthetic complications: no    Last Vitals:  Vitals:   08/18/16 1430 08/18/16 1445  BP: 112/67 113/68  Pulse: (!) 103 (!) 117  Resp: 13 16  Temp:  36.7 C    Last Pain:  Vitals:   08/18/16 1445  TempSrc:   PainSc: 3                  Effie Berkshire

## 2016-08-18 NOTE — ED Notes (Signed)
Pt is ready for OR.  Notified.

## 2016-08-18 NOTE — Op Note (Signed)
08/18/2016  1:30 PM  PATIENT:  Cheryl Stein, 36 y.o., female, MRN: [id number]  PREOP DIAGNOSIS:  Gallbladder Disease  POSTOP DIAGNOSIS:   Gall bladder disease, cholelithiasis  PROCEDURE:   Procedure(s): LAPAROSCOPIC CHOLECYSTECTOMY WITH INTRAOPERATIVE CHOLANGIOGRAM  SURGEON:   Alphonsa Overall, M.D.  ASSISTANT:   Brigid Re, P.A.  ANESTHESIA:   general  Anesthesiologist: Oleta Mouse, MD CRNA: Ofilia Neas, CRNA  General  ASA: 2E  EBL:  Minimal  ml  BLOOD ADMINISTERED: none  DRAINS: none   LOCAL MEDICATIONS USED:   30 cc 1/4 % marcaine  SPECIMEN:   Gall bladder  COUNTS CORRECT:  YES  INDICATIONS FOR PROCEDURE:  Cheryl Stein is a 36 y.o. (DOB: 15-Dec-1980) whtie female whose primary care physician is Kathyrn Lass, MD and comes for cholecystectomy.   The indications and risks of the gall bladder surgery were explained to the patient.  The risks include, but are not limited to, infection, bleeding, common bile duct injury and open surgery.  SURGERY:  The patient was taken to OR room #4 at [facility].  The abdomen was prepped with chloroprep.  The patient was given 2 gm Ancef at the beginning of the operation.   A time out was held and the surgical checklist run.   An infraumbilical incision was made into the abdominal cavity.  A 12 mm Hasson trocar was inserted into the abdominal cavity through the infraumbilical incision and secured with a 0 Vicryl suture.  Three additional trocars were inserted: a 10 mm trocar in the sub-xiphoid location, a 5 mm trocar in the right mid subcostal area, and a 5 mm trocar in the right lateral subcostal area.   The abdomen was explored and the liver, stomach, and bowel that could be seen were unremarkable.   The gall bladder was mildly thicened with some adhesions to the duodenum.   I grasped the gall bladder and rotated it cephalad.  Disssection was carried down to the gall bladder/cystic duct junction and the  cystic duct isolated.  A clip was placed on the gall bladder side of the cystic duct.   An intra-operative cholangiogram was shot.   The intra-operative cholangiogram was shot using a cut off Taut catheter placed through a 14 gauge angiocath in the RUQ.  The Taut catheter was inserted in the cut cystic duct and secured with an endoclip.  A cholangiogram was shot with 8 cc of 1/2 strength Isoview.  Using fluoroscopy, the cholangiogram showed the flow of contrast into the common bile duct, up the hepatic radicals, and into the duodenum.  There was no mass or obstruction. The cystic duct was long, crossed over the CDB and entered into the CBD on the left side.  This was a normal intra-operative cholangiogram.   The Taut catheter was removed.  The cystic duct was tripley endoclipped and the cystic artery was identified and clipped.  The gall bladder was bluntly and sharpley dissected from the gall bladder bed.   After the gall bladder was removed from the liver, the gall bladder bed and Tri[place] of [place] were inspected.  There was no bleeding or bile leak.  The gall bladder was placed in a endocatch bag and delivered through the umbilicus.  The abdomen was irrigated with 2,000 cc saline.   The trocars were then removed.  I infiltrated 30cc of 1/4% Marcaine into the incisions.  The umbilical port closed with a 0 Vicryl suture and the skin closed with 4-0 Monocryl.  The skin was  painted with DermaBond.  The patient's sponge and needle count were correct.  The patient was transported to the RR in good condition.   She wants to try to go home today.  Alphonsa Overall, MD, [facility] Surgery Pager: [phone] Office phone:  [phone]

## 2016-08-18 NOTE — Anesthesia Preprocedure Evaluation (Addendum)
Anesthesia Evaluation  Patient identified by MRN, date of birth, ID band Patient awake    Reviewed: Allergy & Precautions, NPO status , Patient's Chart, lab work & pertinent test results  Airway Mallampati: II  TM Distance: >3 FB Neck ROM: Full    Dental  (+) Teeth Intact, Dental Advisory Given   Pulmonary neg pulmonary ROS,    breath sounds clear to auscultation       Cardiovascular negative cardio ROS   Rhythm:Regular Rate:Normal     Neuro/Psych negative neurological ROS  negative psych ROS   GI/Hepatic Neg liver ROS, GERD  Medicated,  Endo/Other  negative endocrine ROS  Renal/GU negative Renal ROS  negative genitourinary   Musculoskeletal negative musculoskeletal ROS (+)   Abdominal   Peds negative pediatric ROS (+)  Hematology negative hematology ROS (+)   Anesthesia Other Findings   Reproductive/Obstetrics negative OB ROS                            Anesthesia Physical Anesthesia Plan  ASA: II  Anesthesia Plan: General   Post-op Pain Management:    Induction: Intravenous  PONV Risk Score and Plan: 4 or greater and Ondansetron, Dexamethasone, Propofol, Midazolam and Scopolamine patch - Pre-op  Airway Management Planned: Oral ETT  Additional Equipment:   Intra-op Plan:   Post-operative Plan: Extubation in OR  Informed Consent: I have reviewed the patients History and Physical, chart, labs and discussed the procedure including the risks, benefits and alternatives for the proposed anesthesia with the patient or authorized representative who has indicated his/her understanding and acceptance.   Dental advisory given  Plan Discussed with: CRNA  Anesthesia Plan Comments:         Anesthesia Quick Evaluation

## 2016-08-18 NOTE — Discharge Summary (Signed)
[facility] [facility] Discharge Summary   Patient ID: Cheryl Stein MRN: [id number] DOB/AGE: January 15, 1981 36 y.o.  Admit date: 08/18/2016 Discharge date: 08/18/2016  Admitting Diagnosis: Cholelithiasis  Discharge Diagnosis Cholelithiasis  Consultants None  Imaging: Dg Cholangiogram Operative  Result Date: 08/18/2016 CLINICAL DATA:  Right upper quadrant pain EXAM: INTRAOPERATIVE CHOLANGIOGRAM TECHNIQUE: Cholangiographic images from the C-arm fluoroscopic device were submitted for interpretation post-operatively. Please see the procedural report for the amount of contrast and the fluoroscopy time utilized. COMPARISON:  None. FINDINGS: Contrast fills the biliary tree and duodenum without persistent round filling defects in the biliary tree. IMPRESSION: Patent biliary tree Electronically Signed   By: Marybelle Killings M.D.   On: 08/18/2016 14:38   US Abdomen Limited Ruq  Result Date: 08/18/2016 CLINICAL DATA:  Epigastric pain EXAM: ULTRASOUND ABDOMEN LIMITED RIGHT UPPER QUADRANT COMPARISON:  None. FINDINGS: Gallbladder: Multiple small gallstones measuring up to 7.6 mm. Gallbladder wall thickness is normal 1.2 mm. Negative sonographic Murphy sign Common bile duct: Diameter: 3.5 mm Liver: No focal lesion identified. Within normal limits in parenchymal echogenicity. IMPRESSION: Cholelithiasis without evidence of cholecystitis. Electronically Signed   By: Franchot Gallo M.D.   On: 08/18/2016 07:28    Procedures Dr. Lucia Gaskins (08/18/16) - Laparoscopic Cholecystectomy with [place] Hospital Course:  Patient is a 36 y.o. female who [facility] to [facility] with abdominal pain.  Workup showed cholelithiasis without acute cholecystitis.  Patient was taken to the OR and underwent procedure listed above.  Tolerated procedure well and was transferred to the PACU. On POD#0, the patient was voiding well, tolerating clear liquids, ambulating well, pain well controlled, vital signs stable, incisions c/d/i and felt stable  for discharge home.  Patient will follow up in our office in 2 weeks and knows to call with questions or concerns. She will call to confirm appointment date/time.    I have personally looked this patient up in the [place] Controlled Substance Database and reviewed their medications.   Allergies as of 08/18/2016      Reactions   Codeine Nausea And Vomiting   Latex Rash      Medication List    TAKE these medications   aspirin 81 MG chewable tablet Chew 81 mg by mouth daily.   EVENING PRIMROSE OIL PO Take 2 capsules by mouth daily.   HYDROcodone-acetaminophen 5-325 MG tablet Commonly known as:  NORCO/VICODIN Take 1-2 tablets by mouth every 6 (six) hours as needed for moderate pain.   misoprostol 200 MCG tablet Commonly known as:  CYTOTEC Take 1 tablet (200 mcg total) by mouth 3 (three) times daily.   ondansetron 4 MG disintegrating tablet Commonly known as:  ZOFRAN ODT Take 1 tablet (4 mg total) by mouth every 8 (eight) hours as needed for nausea or vomiting.   prenatal multivitamin Tabs tablet Take 1 tablet by mouth daily at 12 noon.   ranitidine 150 MG tablet Commonly known as:  ZANTAC Take 150 mg by mouth as needed for heartburn.        Follow-up Information    [facility]. Call in 1 day(s).   Specialty:  General Surgery Why:  Call to find out appointment time. Plan to arrive 30 min prior to appointment time for check in. Bring photo ID and insurance information with you.  Contact information: [address] [place] [address] [phone]           Signed: Brigid Re , [facility] 08/18/2016, 3:06 PM Pager: [phone] Consults: [phone] Mon-Fri  7:00 am-4:30 pm Sat-Sun 7:00 am-11:30 am  Agree with above. She really wants to go home to be with daughter.  Alphonsa Overall, MD, [facility] Pager: [phone] Office phone:  [phone]

## 2016-08-18 NOTE — Anesthesia Procedure Notes (Signed)
Procedure Name: Intubation Date/Time: 08/18/2016 12:09 PM Performed by: Oleta Mouse Pre-anesthesia Checklist: Patient identified, Emergency Drugs available, Suction available, Timeout performed and Patient being monitored Patient Re-evaluated:Patient Re-evaluated prior to inductionOxygen Delivery Method: Circle system utilized Preoxygenation: Pre-oxygenation with 100% oxygen Intubation Type: IV induction Ventilation: Mask ventilation without difficulty Laryngoscope Size: Mac and 3 Grade View: Grade II Tube type: Oral Tube size: 7.5 mm Number of attempts: 1 Airway Equipment and Method: Stylet Placement Confirmation: ETT inserted through vocal cords under direct vision,  positive ETCO2 and breath sounds checked- equal and bilateral Secured at: 21 cm Tube secured with: Tape Dental Injury: Teeth and Oropharynx as per pre-operative assessment  Comments: Anterior.  Cords visualized with head lift/cricoid pressure.

## 2016-08-19 ENCOUNTER — Encounter (HOSPITAL_COMMUNITY): Payer: Self-pay | Admitting: Surgery

## 2017-07-29 ENCOUNTER — Encounter: Payer: Self-pay | Admitting: Internal Medicine

## 2017-09-28 ENCOUNTER — Ambulatory Visit (INDEPENDENT_AMBULATORY_CARE_PROVIDER_SITE_OTHER): Payer: Managed Care, Other (non HMO) | Admitting: Internal Medicine

## 2017-09-28 ENCOUNTER — Encounter: Payer: Self-pay | Admitting: Internal Medicine

## 2017-09-28 ENCOUNTER — Other Ambulatory Visit (INDEPENDENT_AMBULATORY_CARE_PROVIDER_SITE_OTHER): Payer: Managed Care, Other (non HMO)

## 2017-09-28 ENCOUNTER — Encounter

## 2017-09-28 VITALS — Ht 66.5 in | Wt 133.0 lb

## 2017-09-28 DIAGNOSIS — R110 Nausea: Secondary | ICD-10-CM

## 2017-09-28 DIAGNOSIS — R1013 Epigastric pain: Secondary | ICD-10-CM

## 2017-09-28 DIAGNOSIS — R6881 Early satiety: Secondary | ICD-10-CM | POA: Diagnosis not present

## 2017-09-28 DIAGNOSIS — R140 Abdominal distension (gaseous): Secondary | ICD-10-CM

## 2017-09-28 LAB — COMPREHENSIVE METABOLIC PANEL
ALBUMIN: 4.3 g/dL (ref 3.5–5.2)
ALT: 9 U/L (ref 0–35)
AST: 13 U/L (ref 0–37)
Alkaline Phosphatase: 31 U/L — ABNORMAL LOW (ref 39–117)
BILIRUBIN TOTAL: 0.5 mg/dL (ref 0.2–1.2)
BUN: 14 mg/dL (ref 6–23)
CALCIUM: 9.1 mg/dL (ref 8.4–10.5)
CO2: 24 meq/L (ref 19–32)
Chloride: 105 mEq/L (ref 96–112)
Creatinine, Ser: 0.71 mg/dL (ref 0.40–1.20)
GFR: 97.82 mL/min (ref >60.00)
Glucose, Bld: 91 mg/dL (ref 70–99)
Potassium: 3.8 mEq/L (ref 3.5–5.1)
Sodium: 138 mEq/L (ref 135–145)
Total Protein: 7.2 g/dL (ref 6.0–8.3)

## 2017-09-28 LAB — CBC WITH DIFFERENTIAL/PLATELET
BASOS ABS: 0 10*3/uL (ref 0.0–0.1)
Basophils Relative: 0.6 % (ref 0.0–3.0)
Eosinophils Absolute: 0.1 10*3/uL (ref 0.0–0.7)
Eosinophils Relative: 1.8 % (ref 0.0–5.0)
HCT: 37.5 % (ref 36.0–46.0)
Hemoglobin: 12.9 g/dL (ref 12.0–15.0)
LYMPHS PCT: 23 % (ref 12.0–46.0)
Lymphs Abs: 1.7 10*3/uL (ref 0.7–4.0)
MCHC: 34.3 g/dL (ref 30.0–36.0)
MCV: 86 fl (ref 78.0–100.0)
MONOS PCT: 5.5 % (ref 3.0–12.0)
Monocytes Absolute: 0.4 10*3/uL (ref 0.1–1.0)
NEUTROS ABS: 5.2 10*3/uL (ref 1.4–7.7)
NEUTROS PCT: 69.1 % (ref 43.0–77.0)
PLATELETS: 241 10*3/uL (ref 150.0–400.0)
RBC: 4.36 Mil/uL (ref 3.87–5.11)
RDW: 12.3 % (ref 11.5–15.5)
WBC: 7.6 10*3/uL (ref 4.0–10.5)

## 2017-09-28 LAB — IGA: IGA: 229 mg/dL (ref 68–378)

## 2017-09-28 LAB — TSH: TSH: 0.82 u[IU]/mL (ref 0.35–4.50)

## 2017-09-28 MED ORDER — ONDANSETRON HCL 4 MG PO TABS: 4.0000 mg | ORAL_TABLET | Freq: Three times a day (TID) | ORAL | 2 refills | Status: DC | PRN

## 2017-09-28 NOTE — Patient Instructions (Addendum)
If you are age 38 or older, your body mass index should be between 23-30. Your Body mass index is 21.15 kg/m. If this is out of the aforementioned range listed, please consider follow up with your Primary Care Provider.  If you are age 71 or younger, your body mass index should be between 19-25. Your Body mass index is 21.15 kg/m. If this is out of the aformentioned range listed, please consider follow up with your Primary Care Provider.   Please go to the lab in the basement of our building to have lab work done as you leave today.  You have been scheduled for a gastric emptying scan at [facility] on Tuesday, August 13th, 2019 at 9:30am. Please arrive at least 15 minutes prior to your appointment for registration. Please make certain not to have anything to eat or drink after midnight the night before your test. Hold all stomach medications (ex: Zofran, phenergan, Reglan) 72 hours prior to your test. If you need to reschedule your appointment, please contact radiology scheduling at [phone]. _____________________________________________________________________ A gastric-emptying study measures how long it takes for food to move through your stomach. There are several ways to measure stomach emptying. In the most common test, you eat food that contains a small amount of radioactive material. A scanner that detects the movement of the radioactive material is placed over your abdomen to monitor the rate at which food leaves your stomach. This test normally takes about 4 hours to complete. _____________________________________________________________________  We have sent the following medications to your pharmacy for you to pick up at your convenience: Zofran  Thank you, Dr. Hilarie Fredrickson

## 2017-09-28 NOTE — Progress Notes (Signed)
Patient ID: Cheryl Stein, female   DOB: [birth year], 37 y.o.   MRN: [id number] HPI: Cheryl Stein is a 37 year old female with history of gallbladder disease status post cholecystectomy approximately 13 months ago, history of endometriosis status post prior laparoscopy with Dr. 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 Sabra Heck to evaluate nausea upper abdominal pain.  She is here alone today.  The patient reports that she had issues with on and off attacks of epigastric abdominal pain for several years.  These were intermittent and she basically ignored them.  Then she had a severe attack in June 2018 that took her to the ER.  There she was diagnosed with some somatic cholelithiasis and taken for cholecystectomy by Dr. Lucia Gaskins.   Since this time she has been dealing with almost daily nausea described as a queasy feeling in her epigastrium.  This is despite eating a very healthy low-fat diet.  She is able to workout and exercise on a daily basis.  Diet does not affect her nausea but it is almost always there.  It does not lead to vomiting.  It is associated with a burning upper abdominal pain.  She has early satiety and bloating.  At times she feels like food just sits in her stomach.  She does not have true heartburn very often but occasionally has used Zantac.  She denies dysphagia and odynophagia.  Somewhat separate from this she will have intermittent lower abdominal crampy discomfort which she has associated with her prior endometriosis.  The lower abdominal crampy pain does seem to be somewhat cyclical and is worse several days before and during her menstrual cycle.  She reports having a very regular 27-day menstrual cycle.  She is actively trying to conceive a child but has had fertility  issues in the past.  She does use heparin 6 days after ovulation and continues this until she determines if she is pregnant or not.  This is under direction of her gynecologist Dr. Corinna Capra.  She has 2 children ages 16 and nearly 7 years.  Bowel movements are usually regular, formed without blood or melena.  She can at times have loose stools or even constipation but most recently stools are regular.  She does take a daily probiotic and a daily digestive enzyme.  In the past she has avoided dairy and gluten without benefit in her symptoms.  She is tried Zofran which does help with the nausea but she has not had a prescription since her gallbladder surgery and so she has been trying to save these and use only if absolutely necessary.  Family history unremarkable from a GI perspective.  She is a Agricultural engineer.  No current or former tobacco use.  One alcohol drink per day or less.  She does drink caffeine daily.  Past Medical History:  Diagnosis Date  . AMA (advanced maternal age) multigravida 77+   . Anemia    periodic  . Endometriosis   . Family history of adverse reaction to anesthesia    mother has PONV  . GERD (gastroesophageal reflux disease)   . History of multiple miscarriages   . Hx of varicella   . Low blood pressure reading     Past Surgical History:  Procedure Laterality Date  . CESAREAN SECTION  12/21/2010   Procedure: CESAREAN SECTION;  Surgeon: Luz Lex, MD;  Location: [facility];  Service: Gynecology;  Laterality: N/A;  Primary Cesarean Section with birth of baby boy @ 30  . CHOLECYSTECTOMY N/A 08/18/2016   Procedure: LAPAROSCOPIC CHOLECYSTECTOMY WITH INTRAOPERATIVE CHOLANGIOGRAM;  Surgeon: Alphonsa Overall, MD;  Location: [facility];  Service: General;  Laterality: N/A;  . DILATION AND EVACUATION    . DILATION AND EVACUATION N/A 02/15/2016   Procedure: DILATATION AND EVACUATION (D&E) 2ND TRIMESTER WITH GENETIC STUDIES;  Surgeon: Louretta Shorten, MD;  Location: [facility];  Service: Gynecology;   Laterality: N/A;  ok per [place] for this date and time  . HYMENECTOMY    . LAPAROSCOPY    . WISDOM TOOTH EXTRACTION      Outpatient Medications Prior to Visit  Medication Sig Dispense Refill  . aspirin 81 MG chewable tablet Chew 81 mg by mouth daily.    . heparin 5000 UNIT/ML injection Inject 5,000 Units into the skin every 8 (eight) hours. 7 days out of the month after ovulation    . ondansetron (ZOFRAN ODT) 4 MG disintegrating tablet Take 1 tablet (4 mg total) by mouth every 8 (eight) hours as needed for nausea or vomiting. 10 tablet 0  . Prenatal Vit-Fe Fumarate-FA (PRENATAL MULTIVITAMIN) TABS tablet Take 1 tablet by mouth daily at 12 noon.    . ranitidine (ZANTAC) 150 MG tablet Take 150 mg by mouth as needed for heartburn.    Marland Kitchen EVENING PRIMROSE OIL PO Take 2 capsules by mouth daily.    Marland Kitchen HYDROcodone-acetaminophen (NORCO/VICODIN) 5-325 MG tablet Take 1-2 tablets by mouth every 6 (six) hours as needed for moderate pain. 20 tablet 0  . misoprostol (CYTOTEC) 200 MCG tablet Take 1 tablet (200 mcg total) by mouth 3 (three) times daily. 6 tablet 0   No facility-administered medications prior to visit.     Allergies  Allergen Reactions  . Codeine Nausea And Vomiting  . Latex Rash    Family History  Problem Relation Age of Onset  . Hypertension Mother   . Parkinson's disease Father   . Birth defects Brother        anacephaly  . Diabetes Maternal Grandfather   . Diabetes Paternal Grandmother     Social History   Tobacco Use  . Smoking status: Never Smoker  . Smokeless tobacco: Never Used  Substance Use Topics  . Alcohol use: Yes    Comment: occ  . Drug use: No    ROS: As per history of present illness, otherwise negative  Ht 5' 6.5" (1.689 m)   Wt 133 lb (60.3 kg)   BMI 21.15 kg/m  Constitutional: Well-developed and well-nourished. No distress. HEENT: Normocephalic and atraumatic. Oropharynx is clear and moist. Conjunctivae are normal.  No scleral icterus. Neck:  Neck supple. Trachea midline. Cardiovascular: Normal rate, regular rhythm and intact distal pulses. No M/R/G Pulmonary/chest: Effort normal and breath sounds normal. No wheezing, rales or rhonchi. Abdominal: Soft, nontender, nondistended. Bowel sounds active throughout. There are no masses palpable. No hepatosplenomegaly. Extremities: no clubbing, cyanosis, or edema Neurological: Alert and oriented to person place and time. Skin: Skin is warm and dry.  Psychiatric: Normal mood and affect. Behavior is normal.  RELEVANT LABS AND IMAGING: CBC    Component Value Date/Time   WBC 13.3 (H) 08/18/2016 0539   RBC 5.00 08/18/2016 0539   HGB 14.8 08/18/2016 0539   HCT 42.5 08/18/2016 0539   PLT 266 08/18/2016 0539   MCV 85.0 08/18/2016 0539   MCH 29.6 08/18/2016 0539   MCHC 34.8 08/18/2016 0539  RDW 12.5 08/18/2016 0539    CMP     Component Value Date/Time   NA 138 08/18/2016 0539   K 3.9 08/18/2016 0539   CL 105 08/18/2016 0539   CO2 24 08/18/2016 0539   GLUCOSE 124 (H) 08/18/2016 0539   BUN 19 08/18/2016 0539   CREATININE 0.69 08/18/2016 0539   CALCIUM 9.0 08/18/2016 0539   PROT 7.5 08/18/2016 0539   ALBUMIN 4.3 08/18/2016 0539   AST 23 08/18/2016 0539   ALT 15 08/18/2016 0539   ALKPHOS 47 08/18/2016 0539   BILITOT 1.5 (H) 08/18/2016 0539   GFRNONAA >60 08/18/2016 0539   GFRAA >60 08/18/2016 0539    ASSESSMENT/PLAN: 37 year old female with history of gallbladder disease status post cholecystectomy approximately 13 months ago, history of endometriosis status post prior laparoscopy with Dr. 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 Sabra Heck to evaluate nausea upper abdominal pain.   1. Daily nausea/early satiety/gastric pain/bloating --we discussed her symptoms at length  today.  I recommended the following testing to help figure out what is going on and hopefully find treatment.  Differential includes idiopathic gastroparesis, dyspepsia, gastritis, H. pylori infection, celiac disease. --Check CBC, CMP, celiac panel and TSH. --Check H. pylori stool antigen --Proceed with 4-hour gastric emptying scan --Can use Zofran 4 mg ODT every 8 hours as needed for nausea.  Avoid this for 72 hours before gastric emptying scan --If the above unremarkable and I would recommend 30-day trial of PPI and if no benefit then upper endoscopy --I expect she will need a pregnancy test before her gastric emptying study which can be done in the radiology department before this test.  2.  Lower abdominal crampy pain --more likely related to endometriosis than the issues discussed in #1.  We will further evaluate #1.  In the future Bentyl may help when this pain is bothersome.  I also expect endometriosis treatment such as hormonal therapy would likely improve this pain but she is actively trying to conceive.   HE:RDEYCX, [place], [address], [place] [id number]

## 2017-09-29 ENCOUNTER — Other Ambulatory Visit: Payer: Managed Care, Other (non HMO)

## 2017-09-29 DIAGNOSIS — R110 Nausea: Secondary | ICD-10-CM

## 2017-09-29 DIAGNOSIS — R1013 Epigastric pain: Secondary | ICD-10-CM

## 2017-09-29 DIAGNOSIS — R6881 Early satiety: Secondary | ICD-10-CM

## 2017-09-29 DIAGNOSIS — R140 Abdominal distension (gaseous): Secondary | ICD-10-CM

## 2017-09-30 LAB — HELICOBACTER PYLORI  SPECIAL ANTIGEN
MICRO NUMBER: 90899172
SPECIMEN QUALITY: ADEQUATE

## 2017-10-01 LAB — TISSUE TRANSGLUTAMINASE ABS,IGG,IGA
(tTG) Ab, IgA: 1 U/mL
(tTG) Ab, IgG: 2 U/mL

## 2017-10-08 ENCOUNTER — Telehealth: Payer: Self-pay | Admitting: Internal Medicine

## 2017-10-08 NOTE — Telephone Encounter (Signed)
She is scheduled for a gastric emptying study 10-13-17 and is wondering how long after the test can she breastfeed her child?  Please advise.  Thank you.

## 2017-10-08 NOTE — Telephone Encounter (Signed)
Patient states she has a few questions regarding gastric emptying scheduled for 8.13.19 and would like to speak with the nurse.

## 2017-10-11 NOTE — Telephone Encounter (Signed)
This question is best answered by radiologist with nuclear medicine who will be reading this study Please provide her with the phone number to radiology at the location she is to have this study. If they cannot answer, have her let me know Thanks

## 2017-10-12 ENCOUNTER — Telehealth: Payer: Self-pay

## 2017-10-12 NOTE — Telephone Encounter (Signed)
Per Nuclear Med at [facility] she should pump and dump her breast milk for 24 hours after the gastric emptying study.  She is aware.

## 2017-10-13 ENCOUNTER — Ambulatory Visit (HOSPITAL_COMMUNITY)
Admission: RE | Admit: 2017-10-13 | Discharge: 2017-10-13 | Disposition: A | Discharge status: Home / Self Care | Payer: Managed Care, Other (non HMO) | Source: Ambulatory Visit | Attending: Internal Medicine | Admitting: Internal Medicine

## 2017-10-13 DIAGNOSIS — R6881 Early satiety: Secondary | ICD-10-CM | POA: Diagnosis not present

## 2017-10-13 DIAGNOSIS — R110 Nausea: Secondary | ICD-10-CM | POA: Insufficient documentation

## 2017-10-13 DIAGNOSIS — R140 Abdominal distension (gaseous): Secondary | ICD-10-CM | POA: Insufficient documentation

## 2017-10-13 DIAGNOSIS — R1013 Epigastric pain: Secondary | ICD-10-CM | POA: Insufficient documentation

## 2017-10-13 MED ORDER — TECHNETIUM TC 99M SULFUR COLLOID
2.2000 | Freq: Once | INTRAVENOUS | Status: AC | PRN
  Administered 2017-10-13: 2.2 via INTRAVENOUS

## 2017-10-15 ENCOUNTER — Other Ambulatory Visit: Payer: Self-pay

## 2017-10-15 MED ORDER — PANTOPRAZOLE SODIUM 40 MG PO TBEC: 40.0000 mg | DELAYED_RELEASE_TABLET | Freq: Every day | ORAL | 1 refills | Status: DC

## 2017-12-07 ENCOUNTER — Telehealth: Payer: Self-pay | Admitting: Internal Medicine

## 2017-12-07 NOTE — Telephone Encounter (Signed)
Patient states she finished her protonix is still having some symptoms. Patient wanting to know if there could be labs done to rule out pancreatic symptoms, before her follow up scheduled for 11.15.19

## 2017-12-07 NOTE — Telephone Encounter (Signed)
Pt states she took the protonix for 30 days but it not help much. Pt wants to know if she can have labs done to check out her pancreas prior to her follow-up appointment with Dr. Hilarie Fredrickson in November. She has been looking online and thinks some of her symptoms sound related to pancreas issues.

## 2017-12-07 NOTE — Telephone Encounter (Signed)
Okay to check amylase and lipase.  Would also check CMP. We will discuss symptoms and follow-up; I also think it may be worth having food allergy testing

## 2017-12-08 ENCOUNTER — Other Ambulatory Visit: Payer: Self-pay

## 2017-12-08 ENCOUNTER — Telehealth: Payer: Self-pay | Admitting: Internal Medicine

## 2017-12-08 ENCOUNTER — Other Ambulatory Visit (INDEPENDENT_AMBULATORY_CARE_PROVIDER_SITE_OTHER): Payer: Managed Care, Other (non HMO)

## 2017-12-08 DIAGNOSIS — R109 Unspecified abdominal pain: Secondary | ICD-10-CM

## 2017-12-08 LAB — COMPREHENSIVE METABOLIC PANEL
ALBUMIN: 4.3 g/dL (ref 3.5–5.2)
ALK PHOS: 42 U/L (ref 39–117)
ALT: 11 U/L (ref 0–35)
AST: 14 U/L (ref 0–37)
BUN: 11 mg/dL (ref 6–23)
CO2: 29 mEq/L (ref 19–32)
CREATININE: 0.69 mg/dL (ref 0.40–1.20)
Calcium: 9.5 mg/dL (ref 8.4–10.5)
Chloride: 102 mEq/L (ref 96–112)
GFR: 100.99 mL/min (ref >60.00)
Glucose, Bld: 94 mg/dL (ref 70–99)
Potassium: 3.8 mEq/L (ref 3.5–5.1)
Sodium: 138 mEq/L (ref 135–145)
TOTAL PROTEIN: 7.3 g/dL (ref 6.0–8.3)
Total Bilirubin: 0.7 mg/dL (ref 0.2–1.2)

## 2017-12-08 LAB — LIPASE: LIPASE: 29 U/L (ref 11.0–59.0)

## 2017-12-08 LAB — AMYLASE: Amylase: 32 U/L (ref 27–131)

## 2017-12-08 NOTE — Telephone Encounter (Signed)
Pt calling for results

## 2017-12-08 NOTE — Telephone Encounter (Signed)
Patient husband calling to see if lab results are back from today 10.8.19. Advised patients husband that we would call patient as soon as results came back.

## 2017-12-08 NOTE — Telephone Encounter (Signed)
Spoke with pt and she is aware, orders in epic for labs and pt will come to have the blood drawn.

## 2017-12-09 NOTE — Telephone Encounter (Signed)
See result note.  

## 2017-12-31 ENCOUNTER — Encounter: Payer: Self-pay | Admitting: Nurse Practitioner

## 2017-12-31 ENCOUNTER — Other Ambulatory Visit: Payer: 59

## 2017-12-31 ENCOUNTER — Ambulatory Visit (INDEPENDENT_AMBULATORY_CARE_PROVIDER_SITE_OTHER): Payer: 59 | Admitting: Nurse Practitioner

## 2017-12-31 VITALS — BP 98/62 | Ht 66.0 in | Wt 135.1 lb

## 2017-12-31 DIAGNOSIS — R110 Nausea: Secondary | ICD-10-CM

## 2017-12-31 DIAGNOSIS — R1012 Left upper quadrant pain: Secondary | ICD-10-CM | POA: Diagnosis not present

## 2017-12-31 DIAGNOSIS — R112 Nausea with vomiting, unspecified: Secondary | ICD-10-CM

## 2017-12-31 DIAGNOSIS — R197 Diarrhea, unspecified: Secondary | ICD-10-CM | POA: Diagnosis not present

## 2017-12-31 DIAGNOSIS — R140 Abdominal distension (gaseous): Secondary | ICD-10-CM

## 2017-12-31 NOTE — Progress Notes (Addendum)
Chief Complaint:   Left upper quadrant pain, diarrhea  IMPRESSION and PLAN:    88.  37 year old female with progression of chronic LUQ burning radiating through to back.  No relief with PPI . Burning is constant, unrelated to meals or movement.  She has chronic intermittent nausea as well.  Normal gastric emptying study in August.  Lipase and CMET normal. -Given progression of the LUQ burning and persistent, intermittent nausea will proceed with upper endoscopy. The risks and benefits of EGD were discussed and the patient agrees to proceed.   2.  Worsening of chronic intermittent diarrhea.  Stools non-bloody but some mucus. Also describes stool as oily as of late   -Pancreatic insufficiency/malabsorption seems unlikely given the absence of weight loss but will check stool fecal fat.  -consider fecal elastase  3. Clotting factor abnormality, apparently cause of multiple miscarriages.  Patient trying to conceive. She takes SQ heparin several days following ovulation until pregnancy can be determined. She says there is no chance of pregnancy at present -Asked to hold SQ heparin day before and day of EGD. Ideally EGD will not be done near ovulation and patient will not be on heparin anyway.   Addendum: Reviewed and agree with assessment and management plan. Pyrtle, Lajuan Lines, MD    HPI:     Patient is a 37 year old female known to Dr. Hilarie Fredrickson.  She had a cholecystectomy in June of this year for symptomatic cholelithiasis. She say [place] in late July for saw him in July of this year for evaluation of nausea/ vomiting and burning upper abdominal pain.  Gastric emptying study was normal.  She was prescribed a month of PPI but did not notice any improvement.  The burning pain which she describes as being LUQ has gotten worse. She had an appointment to see Dr. Hilarie Fredrickson next month but needed to be seen sooner.  Pavielle describes chronic LUQ  burning radiating through to her back.  Pain is not related to  meals nor movement.  She cannot correlate the burning to anything it is just always there.  She can only recall a few days when pain has not been present.  She still has intermittent nausea.  No GERD sx, except during pregnancy. No significant NSAID use.  Weight is stable  Janne also describes chronic intermittent diarrhea. Until recently she thought the diarrhea was no more than what would expect after gallbladder removal. A few days ago the diarrhea got  worse.  She has noticed some mucus in her stool but no blood and also describes stool as being oily. Having several bowel movements a day. She has no associated fever.  No recent antibiotic use.  Blinda has two children. She is trying to conceive but has had several miscarriages due to a clotting disorder. She takes SQ heparin for several days following ovulation until pregnancy can be determined.   Data reviewed: 12/08/2017  CMET unremarkable Lipase normal  Review of systems:     No chest pain, no SOB, no fevers, no urinary sx   Past Medical History:  Diagnosis Date  . AMA (advanced maternal age) multigravida 67+   . Anemia    periodic  . Endometriosis   . Family history of adverse reaction to anesthesia    mother has PONV  . GERD (gastroesophageal reflux disease)   . History of multiple miscarriages   . Hx of varicella   . Low blood pressure reading     Patient's surgical  history, family medical history, social history, medications and allergies were all reviewed in Epic   Creatinine clearance cannot be calculated (Patient's most recent lab result is older than the maximum 21 days allowed.)  Current Outpatient Medications  Medication Sig Dispense Refill  . heparin 5000 UNIT/ML injection Inject 5,000 Units into the skin every 8 (eight) hours. 7 days out of the month after ovulation    . ondansetron (ZOFRAN) 4 MG tablet Take 1 tablet (4 mg total) by mouth every 8 (eight) hours as needed for nausea or vomiting. 30 tablet 2  . Prenatal  Vit-Fe Fumarate-FA (PRENATAL MULTIVITAMIN) TABS tablet Take 1 tablet by mouth daily at 12 noon.     No current facility-administered medications for this visit.     Physical Exam:     BP 98/62   Ht 5\' 6"  (1.676 m)   Wt 135 lb 2 oz (61.3 kg)   BMI 21.81 kg/m   GENERAL:  Pleasant female in NAD PSYCH: : Cooperative, normal affect EENT:  conjunctiva pink, mucous membranes moist, neck supple without masses CARDIAC:  RRR, no murmur heard, no peripheral edema PULM: Normal respiratory effort, lungs CTA bilaterally, no wheezing ABDOMEN:  Nondistended, soft, nontender. No obvious masses, no hepatomegaly,  normal bowel sounds SKIN:  turgor, no lesions seen Musculoskeletal:  Normal muscle tone, normal strength NEURO: Alert and oriented x 3, no focal neurologic deficits   Tye Savoy , NP 12/31/2017, 1:37 PM

## 2017-12-31 NOTE — Patient Instructions (Addendum)
If you are age 38 or older, your body mass index should be between 23-30. Your Body mass index is 21.81 kg/m. If this is out of the aforementioned range listed, please consider follow up with your Primary Care Provider.  If you are age 29 or younger, your body mass index should be between 19-25. Your Body mass index is 21.81 kg/m. If this is out of the aformentioned range listed, please consider follow up with your Primary Care Provider.   You have been scheduled for an endoscopy. Please follow written instructions given to you at your visit today. If you use inhalers (even only as needed), please bring them with you on the day of your procedure. Your physician has requested that you go to www.startemmi.com and enter the access code given to you at your visit today. This web site gives a general overview about your procedure. However, you should still follow specific instructions given to you by our office regarding your preparation for the procedure.  Your provider has requested that you go to the basement level for lab work before leaving today. Press "B" on the elevator. The lab is located at the first door on the left as you exit the elevator. Fecal Fat  HOLD HEPARIN THE DAY PRIOR AND DAY OF PROCEDURE.  Thank you for choosing me and [facility].   Tye Savoy, NP

## 2018-01-12 ENCOUNTER — Ambulatory Visit (AMBULATORY_SURGERY_CENTER): Payer: 59 | Admitting: Internal Medicine

## 2018-01-12 ENCOUNTER — Encounter: Payer: Self-pay | Admitting: Internal Medicine

## 2018-01-12 VITALS — BP 112/60 | HR 98 | Temp 99.3°F | Resp 28 | Ht 66.0 in | Wt 135.0 lb

## 2018-01-12 DIAGNOSIS — K2970 Gastritis, unspecified, without bleeding: Secondary | ICD-10-CM | POA: Diagnosis not present

## 2018-01-12 DIAGNOSIS — R1012 Left upper quadrant pain: Secondary | ICD-10-CM

## 2018-01-12 DIAGNOSIS — R110 Nausea: Secondary | ICD-10-CM

## 2018-01-12 DIAGNOSIS — K2950 Unspecified chronic gastritis without bleeding: Secondary | ICD-10-CM | POA: Diagnosis not present

## 2018-01-12 MED ORDER — SODIUM CHLORIDE 0.9 % IV SOLN: 500.0000 mL | Freq: Once | INTRAVENOUS | Status: DC

## 2018-01-12 MED ORDER — PANTOPRAZOLE SODIUM 40 MG PO TBEC: 40.0000 mg | DELAYED_RELEASE_TABLET | Freq: Every day | ORAL | 3 refills | Status: DC

## 2018-01-12 NOTE — Patient Instructions (Signed)
YOU HAD AN ENDOSCOPIC PROCEDURE TODAY AT THE [facility]:   Refer to the procedure report that was given to you for any specific questions about what was found during the examination.  If the procedure report does not answer your questions, please call your gastroenterologist to clarify.  If you requested that your care partner not be given the details of your procedure findings, then the procedure report has been included in a sealed envelope for you to review at your convenience later.  YOU SHOULD EXPECT: Some feelings of bloating in the abdomen. Passage of more gas than usual.  Walking can help get rid of the air that was put into your GI tract during the procedure and reduce the bloating. If you had a lower endoscopy (such as a colonoscopy or flexible sigmoidoscopy) you may notice spotting of blood in your stool or on the toilet paper. If you underwent a bowel prep for your procedure, you may not have a normal bowel movement for a few days.  Please Note:  You might notice some irritation and congestion in your nose or some drainage.  This is from the oxygen used during your procedure.  There is no need for concern and it should clear up in a day or so.  SYMPTOMS TO REPORT IMMEDIATELY:   Following upper endoscopy (EGD)  Vomiting of blood or coffee ground material  New chest pain or pain under the shoulder blades  Painful or persistently difficult swallowing  New shortness of breath  Fever of 100F or higher  Black, tarry-looking stools  For urgent or emergent issues, a gastroenterologist can be reached at any hour by calling [phone].   DIET:  We do recommend a small meal at first, but then you may proceed to your regular diet.  Drink plenty of fluids but you should avoid alcoholic beverages for 24 hours.  ACTIVITY:  You should plan to take it easy for the rest of today and you should NOT DRIVE or use heavy machinery until tomorrow (because of the sedation medicines used  during the test).    FOLLOW UP: Our staff will call the number listed on your records the next business day following your procedure to check on you and address any questions or concerns that you may have regarding the information given to you following your procedure. If we do not reach you, we will leave a message.  However, if you are feeling well and you are not experiencing any problems, there is no need to return our call.  We will assume that you have returned to your regular daily activities without incident.  If any biopsies were taken you will be contacted by phone or by letter within the next 1-3 weeks.  Please call us at [phone] if you have not heard about the biopsies in 3 weeks.    SIGNATURES/CONFIDENTIALITY: You and/or your care partner have signed paperwork which will be entered into your electronic medical record.  These signatures attest to the fact that that the information above on your After Visit Summary has been reviewed and is understood.  Full responsibility of the confidentiality of this discharge information lies with you and/or your care-partner. 

## 2018-01-12 NOTE — Progress Notes (Signed)
Report to RN, VSS, adequate respirations noted, no c/o pain or discomfort 

## 2018-01-12 NOTE — Progress Notes (Signed)
Called to room to assist during endoscopic procedure.  Patient ID and intended procedure confirmed with present staff. Received instructions for my participation in the procedure from the performing physician.  

## 2018-01-12 NOTE — Op Note (Signed)
[place] Patient Name: Cheryl Stein Procedure Date: 01/12/2018 3:52 PM MRN: [id number] Endoscopist: Jerene Bears , MD Age: 37 Referring MD:  Date of Birth: 13-Feb-1981 Gender: Female Account #: [id number] Procedure:                Upper GI endoscopy Indications:              Abdominal pain in the left upper quadrant, Nausea Medicines:                Monitored Anesthesia Care Procedure:                Pre-Anesthesia Assessment:                           - Prior to the procedure, a History and Physical                            was performed, and patient medications and                            allergies were reviewed. The patient's tolerance of                            previous anesthesia was also reviewed. The risks                            and benefits of the procedure and the sedation                            options and risks were discussed with the patient.                            All questions were answered, and informed consent                            was obtained. Prior Anticoagulants: The patient has                            taken no previous anticoagulant or antiplatelet                            agents. ASA Grade Assessment: II - A patient with                            mild systemic disease. After reviewing the risks                            and benefits, the patient was deemed in                            satisfactory condition to undergo the procedure.                           After obtaining informed consent, the endoscope was  passed under direct vision. Throughout the                            procedure, the patient's blood pressure, pulse, and                            oxygen saturations were monitored continuously. The                            Model GIF-HQ190 [id number]) scope was introduced                            through the mouth, and advanced to the second part   of duodenum. The upper GI endoscopy was                            accomplished without difficulty. The patient                            tolerated the procedure well. Scope In: Scope Out: Findings:                 LA Grade A (one or more mucosal breaks less than 5                            mm, not extending between tops of 2 mucosal folds)                            esophagitis with no bleeding was found 35 cm from                            the incisors.                           The entire examined stomach was normal. Biopsies                            were taken with a cold forceps for histology and                            Helicobacter pylori testing.                           The examined duodenum was normal. Biopsies for                            histology were taken with a cold forceps for                            evaluation of celiac disease. Complications:            No immediate complications. Estimated Blood Loss:     Estimated blood loss was minimal. Impression:               - Very mild reflux esophagitis.                           -  Normal stomach. Biopsied.                           - Normal examined duodenum. Biopsied. Recommendation:           - Patient has a contact number available for                            emergencies. The signs and symptoms of potential                            delayed complications were discussed with the                            patient. Return to normal activities tomorrow.                            Written discharge instructions were provided to the                            patient.                           - Resume previous diet.                           - Continue present medications. Consider retrial of                            pantoprazole 40 mg daily.                           - Await pathology results.                           - If biopsies unrevealing, would consider                            cross-sectional  imaging of the abdomen and pelvis                            with CT scan + contrast. Jerene Bears, MD 01/12/2018 4:15:12 PM This report has been signed electronically.

## 2018-01-13 ENCOUNTER — Telehealth: Payer: Self-pay | Admitting: *Deleted

## 2018-01-13 NOTE — Telephone Encounter (Signed)
  Follow up Call-  Call back number 01/12/2018  Post procedure Call Back phone  # [phone] cell  Permission to leave phone message Yes  Some recent data might be hidden     Patient questions:  Do you have a fever, pain , or abdominal swelling? No. Pain Score  0 *  Have you tolerated food without any problems? Yes.    Have you been able to return to your normal activities? Yes.    Do you have any questions about your discharge instructions: Diet   No. Medications  No. Follow up visit  No.  Do you have questions or concerns about your Care? No   Actions: * If pain score is 4 or above: No action needed, pain <4.

## 2018-01-15 ENCOUNTER — Ambulatory Visit: Payer: Managed Care, Other (non HMO) | Admitting: Internal Medicine

## 2018-01-18 ENCOUNTER — Other Ambulatory Visit: Payer: Self-pay

## 2018-01-18 DIAGNOSIS — R1012 Left upper quadrant pain: Secondary | ICD-10-CM

## 2018-01-21 ENCOUNTER — Inpatient Hospital Stay: Admission: RE | Admit: 2018-01-21 | Payer: 59 | Source: Ambulatory Visit

## 2018-02-04 ENCOUNTER — Ambulatory Visit: Payer: 59 | Admitting: Family Medicine

## 2018-03-17 ENCOUNTER — Other Ambulatory Visit: Payer: Self-pay

## 2018-03-17 ENCOUNTER — Ambulatory Visit: Payer: 59 | Admitting: Family Medicine

## 2018-03-17 ENCOUNTER — Encounter: Payer: Self-pay | Admitting: Family Medicine

## 2018-03-17 VITALS — BP 98/64 | HR 98 | Temp 98.0°F | Ht 66.0 in | Wt 137.0 lb

## 2018-03-17 DIAGNOSIS — R1012 Left upper quadrant pain: Secondary | ICD-10-CM

## 2018-03-17 DIAGNOSIS — Z0000 Encounter for general adult medical examination without abnormal findings: Secondary | ICD-10-CM

## 2018-03-17 DIAGNOSIS — Z13220 Encounter for screening for lipoid disorders: Secondary | ICD-10-CM | POA: Diagnosis not present

## 2018-03-17 DIAGNOSIS — Z131 Encounter for screening for diabetes mellitus: Secondary | ICD-10-CM | POA: Diagnosis not present

## 2018-03-17 LAB — LIPID PANEL
Cholesterol: 131 mg/dL (ref 0–200)
HDL: 60.6 mg/dL (ref >39.00)
LDL Cholesterol: 56 mg/dL (ref 0–99)
NonHDL: 70.22
Total CHOL/HDL Ratio: 2
Triglycerides: 69 mg/dL (ref 0.0–149.0)
VLDL: 13.8 mg/dL (ref 0.0–40.0)

## 2018-03-17 LAB — CBC
HCT: 40.5 % (ref 36.0–46.0)
HEMOGLOBIN: 13.6 g/dL (ref 12.0–15.0)
MCHC: 33.6 g/dL (ref 30.0–36.0)
MCV: 82.2 fl (ref 78.0–100.0)
PLATELETS: 292 10*3/uL (ref 150.0–400.0)
RBC: 4.92 Mil/uL (ref 3.87–5.11)
RDW: 13.8 % (ref 11.5–15.5)
WBC: 7.3 10*3/uL (ref 4.0–10.5)

## 2018-03-17 LAB — BASIC METABOLIC PANEL
BUN: 18 mg/dL (ref 6–23)
CHLORIDE: 102 meq/L (ref 96–112)
CO2: 27 meq/L (ref 19–32)
CREATININE: 0.73 mg/dL (ref 0.40–1.20)
Calcium: 9.5 mg/dL (ref 8.4–10.5)
GFR: 94.5 mL/min (ref >60.00)
GLUCOSE: 77 mg/dL (ref 70–99)
Potassium: 4.3 mEq/L (ref 3.5–5.1)
Sodium: 136 mEq/L (ref 135–145)

## 2018-03-17 LAB — HEMOGLOBIN A1C: HEMOGLOBIN A1C: 5.3 % (ref 4.6–6.5)

## 2018-03-17 NOTE — Patient Instructions (Signed)
Preventive Care 18-39 Years, Female Preventive care refers to lifestyle choices and visits with your health care provider that can promote health and wellness. What does preventive care include?   A yearly physical exam. This is also called an annual well check.  Dental exams once or twice a year.  Routine eye exams. Ask your health care provider how often you should have your eyes checked.  Personal lifestyle choices, including: ? Daily care of your teeth and gums. ? Regular physical activity. ? Eating a healthy diet. ? Avoiding tobacco and drug use. ? Limiting alcohol use. ? Practicing safe sex. ? Taking vitamin and mineral supplements as recommended by your health care provider. What happens during an annual well check? The services and screenings done by your health care provider during your annual well check will depend on your age, overall health, lifestyle risk factors, and family history of disease. Counseling Your health care provider may ask you questions about your:  Alcohol use.  Tobacco use.  Drug use.  Emotional well-being.  Home and relationship well-being.  Sexual activity.  Eating habits.  Work and work environment.  Method of birth control.  Menstrual cycle.  Pregnancy history. Screening You may have the following tests or measurements:  Height, weight, and BMI.  Diabetes screening. This is done by checking your blood sugar (glucose) after you have not eaten for a while (fasting).  Blood pressure.  Lipid and cholesterol levels. These may be checked every 5 years starting at age 20.  Skin check.  Hepatitis C blood test.  Hepatitis B blood test.  Sexually transmitted disease (STD) testing.  BRCA-related cancer screening. This may be done if you have a family history of breast, ovarian, tubal, or peritoneal cancers.  Pelvic exam and Pap test. This may be done every 3 years starting at age 21. Starting at age 30, this may be done every 5  years if you have a Pap test in combination with an HPV test. Discuss your test results, treatment options, and if necessary, the need for more tests with your health care provider. Vaccines Your health care provider may recommend certain vaccines, such as:  Influenza vaccine. This is recommended every year.  Tetanus, diphtheria, and acellular pertussis (Tdap, Td) vaccine. You may need a Td booster every 10 years.  Varicella vaccine. You may need this if you have not been vaccinated.  HPV vaccine. If you are 26 or younger, you may need three doses over 6 months.  Measles, mumps, and rubella (MMR) vaccine. You may need at least one dose of MMR. You may also need a second dose.  Pneumococcal 13-valent conjugate (PCV13) vaccine. You may need this if you have certain conditions and were not previously vaccinated.  Pneumococcal polysaccharide (PPSV23) vaccine. You may need one or two doses if you smoke cigarettes or if you have certain conditions.  Meningococcal vaccine. One dose is recommended if you are age 19-21 years and a first-year college student living in a residence hall, or if you have one of several medical conditions. You may also need additional booster doses.  Hepatitis A vaccine. You may need this if you have certain conditions or if you travel or work in places where you may be exposed to hepatitis A.  Hepatitis B vaccine. You may need this if you have certain conditions or if you travel or work in places where you may be exposed to hepatitis B.  Haemophilus influenzae type b (Hib) vaccine. You may need this if you   have certain risk factors. Talk to your health care provider about which screenings and vaccines you need and how often you need them. This information is not intended to replace advice given to you by your health care provider. Make sure you discuss any questions you have with your health care provider. Document Released: 04/15/2001 Document Revised: 09/30/2016  Document Reviewed: 12/19/2014 Elsevier Interactive Patient Education  2019 [employer].

## 2018-03-17 NOTE — Progress Notes (Signed)
Subjective:     Cheryl Stein is a 39 y.o. female and is here for to est care and comprehensive physical exam. The patient reports problems - GERD/stomach isseues, endometriosis, seasonal allergies.  Endometriosis: -Follow[facility] -s/p ex lap for endometriosis in 2012 and 2014 -Endorses ongoing abdominal pain -Also followed by GI for "stomach issues" possibly worsened by endometriosis  GERD: -Taking Protonix -Followed by Dr. 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 -Follow[facility]  Seasonal allergies: -Does not take anything for her symptoms -May use raw local honey  Allergies: Sensitivities to latex Codeine-nausea  Social history: Patient is married.  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  Patient endorses limited social alcohol use.  Patient denies tobacco and drug use.  Health maintenance: Last Pap February 2019 LMP 03/07/2018 Tetanus shot 2016, influenza vaccine 2019  FMHx: Mom-alive, arthritis, birth defect, HLD, HTN, miscarriage Dad-Alive, hearing loss, HTN, Parkinson's Father-Jonathan, deceased, birth defects, anencephaly, early death Daughter-Alive Son-Alive, birth defect, learning disabilities MGM-alive, arthritis, skin cancer, HLD, HTN MGF-deceased, lung cancer, non-smoker, diabetes, hearing loss PGM-deceased, asthma, diabetes PGF-deceased, hearing loss  Social History   Socioeconomic History  . Marital status: Married    Spouse name: Not on file  . Number of children: Not on file  . Years of education: Not on file  . Highest education level: Not on file  Occupational History  . Not  on file  Social Needs  . Financial resource strain: Not on file  . Food insecurity:    Worry: Not on file    Inability: Not on file  . Transportation needs:    Medical: Not on file    Non-medical: Not on file  Tobacco Use  . Smoking status: Never Smoker  . Smokeless tobacco: Never Used  Substance and Sexual Activity  . Alcohol use: Yes    Comment: occ  . Drug use: No  . Sexual activity: Yes    Comment: pt said she knows she is not pregnant  Lifestyle  . Physical activity:    Days per week: Not on file    Minutes per session: Not on file  . Stress: Not on file  Relationships  . Social connections:    Talks on phone: Not on file    Gets together: Not on file    Attends religious service: Not on file    Active member of club or organization: Not on file    Attends meetings of clubs or organizations: Not on file    Relationship status: Not on file  . Intimate partner violence:    Fear of current or ex partner: Not on file    Emotionally abused: Not on file    Physically abused: Not on file    Forced sexual activity: Not on file  Other Topics Concern  . Not on file  Social History Narrative  . Not on file   Health Maintenance  Topic Date Due  . TETANUS/TDAP  07/21/1998  . PAP SMEAR-Modifier  10/30/2017  . INFLUENZA VACCINE  Completed  . HIV Screening  Completed    The following portions of the patient's history were reviewed and updated as  appropriate: allergies, current medications, past family history, past medical history, past social history, past surgical history and problem list.  Review of Systems Pertinent items noted in HPI and remainder of comprehensive ROS otherwise negative.   Objective:    BP 98/64 (BP Location: Left Arm, Patient Position: Sitting, Cuff Size: Normal)   Pulse 98   Temp 98 F (36.7 C) (Oral)   Ht 5\' 6"  (1.676 m)   Wt 137 lb (62.1 kg)   LMP 03/07/2018 (Exact Date)   SpO2 99%   BMI 22.11 kg/m  General appearance: alert,  cooperative and no distress Head: Normocephalic, without obvious abnormality, atraumatic Eyes: conjunctivae/corneas clear. PERRL, EOM's intact. Fundi benign. Ears: normal TM's and external ear canals both ears Nose: Nares normal. Septum midline. Mucosa normal. No drainage or sinus tenderness. Throat: lips, mucosa, and tongue normal; teeth and gums normal Neck: no adenopathy, no carotid bruit, no JVD, supple, symmetrical, trachea midline and thyroid not enlarged, symmetric, no tenderness/mass/nodules Lungs: clear to auscultation bilaterally Heart: regular rate and rhythm, S1, S2 normal, no murmur, click, rub or gallop Abdomen: soft, non-tender; bowel sounds normal; no masses,  no organomegaly Extremities: extremities normal, atraumatic, no cyanosis or edema Skin: Skin color, texture, turgor normal. No rashes or lesions Neurologic: Alert and oriented X 3, normal strength and tone. Normal symmetric reflexes. Normal coordination and gait    Assessment:    Healthy female exam.      Plan:     Anticipatory guidance given including wearing seatbelts, smoke detectors in the home, increasing physical activity, increasing p.o. intake of water and vegetables. 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 -Managed by [facility]N, [facility]  Follow-up PRN  Grier Mitts, MD

## 2018-03-17 NOTE — Telephone Encounter (Signed)
Please let Cheryl Stein know I received her message She has had persistent abd pain, upper left.   EGD unrevealing.  No response to PPI Please proceed with CT abd/pelvis with contrast -- LUQ pain, hx of endometriosis. If pain is cyclical then I would like the study to be done while pain is occurring/active -- if possible

## 2018-04-05 ENCOUNTER — Ambulatory Visit (INDEPENDENT_AMBULATORY_CARE_PROVIDER_SITE_OTHER)
Admission: RE | Admit: 2018-04-05 | Discharge: 2018-04-05 | Disposition: A | Discharge status: Home / Self Care | Payer: 59 | Source: Ambulatory Visit | Attending: Internal Medicine | Admitting: Internal Medicine

## 2018-04-05 DIAGNOSIS — R1012 Left upper quadrant pain: Secondary | ICD-10-CM | POA: Diagnosis not present

## 2018-04-05 MED ORDER — IOPAMIDOL (ISOVUE-370) INJECTION 76%
100.0000 mL | Freq: Once | INTRAVENOUS | Status: AC | PRN
  Administered 2018-04-05: 100 mL via INTRAVENOUS

## 2018-08-23 ENCOUNTER — Encounter: Payer: Self-pay | Admitting: Family Medicine

## 2018-08-23 ENCOUNTER — Ambulatory Visit (INDEPENDENT_AMBULATORY_CARE_PROVIDER_SITE_OTHER): Payer: 59 | Admitting: Family Medicine

## 2018-08-23 ENCOUNTER — Other Ambulatory Visit: Payer: Self-pay

## 2018-08-23 VITALS — BP 110/64 | HR 104 | Temp 98.5°F | Resp 12 | Wt 145.2 lb

## 2018-08-23 DIAGNOSIS — R002 Palpitations: Secondary | ICD-10-CM | POA: Diagnosis not present

## 2018-08-23 DIAGNOSIS — R000 Tachycardia, unspecified: Secondary | ICD-10-CM

## 2018-08-23 NOTE — Progress Notes (Signed)
ACUTE VISIT   HPI:  Chief Complaint  Patient presents with  . Palpitations    x 3 weeks, states her pulse is always high but she has noticed that while doing her house work her heart feels like it is beating faster    [name] is a 38 y.o. female, who is here today complaining of frequent palpitations.  She states that she has had elevated HR but never palpitations. She donates blood regularly and has been turned down because tachycardia.  She has monitor HR and between 115-120, seldom 130's. Fluttery chest sensation. She has not identified exacerbating or alleviating factors.  No associated headache,chest pain,dyspnea,or diaphoresis. She has no symptom when doing chores around her house.   Lab Results  Component Value Date   CREATININE 0.73 03/17/2018   BUN 18 03/17/2018   NA 136 03/17/2018   K 4.3 03/17/2018   CL 102 03/17/2018   CO2 27 03/17/2018   Lab Results  Component Value Date   TSH 0.82 09/28/2017   Lab Results  Component Value Date   WBC 7.3 03/17/2018   HGB 13.6 03/17/2018   HCT 40.5 03/17/2018   MCV 82.2 03/17/2018   PLT 292.0 03/17/2018   Denies feeling anxious. Negative for abdominal wt loss,tremor,or diarrea.  She stopped caffeine intake a few days ago,did not noted changes. She is not taken OTC cold meds or wt loss products.  Review of Systems  Constitutional: Negative for appetite change, chills, fatigue, fever and unexpected weight change.  HENT: Negative for mouth sores and sore throat.   Respiratory: Negative for cough and wheezing.   Cardiovascular: Negative for leg swelling.  Endocrine: Negative for cold intolerance and heat intolerance.  Genitourinary: Negative for decreased urine volume, hematuria, menstrual problem and vaginal bleeding.  Musculoskeletal: Negative for arthralgias and myalgias.  Skin: Negative for pallor and rash.  Neurological: Negative for syncope and headaches.   Psychiatric/Behavioral: Negative for sleep disturbance. The patient is not nervous/anxious.   Rest see pertinent positives and negatives per HPI.   Current Outpatient Medications on File Prior to Visit  Medication Sig Dispense Refill  . heparin 5000 UNIT/ML injection Inject 5,000 Units into the skin every 8 (eight) hours. 7 days out of the month after ovulation    . ondansetron (ZOFRAN) 4 MG tablet Take 1 tablet (4 mg total) by mouth every 8 (eight) hours as needed for nausea or vomiting. 30 tablet 2  . pantoprazole (PROTONIX) 40 MG tablet Take 1 tablet (40 mg total) by mouth daily. 90 tablet 3  . Prenatal Vit-Fe Fumarate-FA (PRENATAL MULTIVITAMIN) TABS tablet Take 1 tablet by mouth daily at 12 noon.     No current facility-administered medications on file prior to visit.      Past Medical History:  Diagnosis Date  . Allergy   . AMA (advanced maternal age) multigravida 16+   . Anemia    periodic  . Anxiety   . Clotting disorder ([place])    to prevent miscarage  . Endometriosis   . Family history of adverse reaction to anesthesia    mother has PONV  . GERD (gastroesophageal reflux disease)   . History of multiple miscarriages   . Hx of varicella   . Low blood pressure reading   . 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   Allergies  Allergen Reactions  . Codeine Nausea And Vomiting  . Latex Rash    Social History  Socioeconomic History  . Marital status: Married    Spouse name: Not on file  . Number of children: Not on file  . Years of education: Not on file  . Highest education level: Not on file  Occupational History  . Not on file  Social Needs  . Financial resource strain: Not on file  . Food insecurity    Worry: Not on file    Inability: Not on file  . Transportation needs    Medical: Not on file    Non-medical: Not on file  Tobacco Use  . Smoking status: Never Smoker  . Smokeless tobacco: Never Used  Substance and Sexual  Activity  . Alcohol use: Yes    Comment: occ  . Drug use: No  . Sexual activity: Yes    Comment: pt said she knows she is not pregnant  Lifestyle  . Physical activity    Days per week: Not on file    Minutes per session: Not on file  . Stress: Not on file  Relationships  . Social Herbalist on phone: Not on file    Gets together: Not on file    Attends religious service: Not on file    Active member of club or organization: Not on file    Attends meetings of clubs or organizations: Not on file    Relationship status: Not on file  Other Topics Concern  . Not on file  Social History Narrative  . Not on file    Vitals:   08/23/18 1434 08/23/18 1448  BP: 110/64   Pulse: (!) 113 (!) 104  Resp: 12   Temp: 98.5 F (36.9 C)   SpO2: 99%    Body mass index is 23.44 kg/m.   Physical Exam  Nursing note and vitals reviewed. Constitutional: She is oriented to person, place, and time. She appears well-developed and well-nourished. No distress.  HENT:  Head: Normocephalic and atraumatic.  Mouth/Throat: Oropharynx is clear and moist and mucous membranes are normal.  Eyes: Pupils are equal, round, and reactive to light. Conjunctivae are normal.  Neck: No thyromegaly present.  Cardiovascular: Regular rhythm. Tachycardia present.  No murmur heard. Pulses:      Dorsalis pedis pulses are 2+ on the right side and 2+ on the left side.  Respiratory: Effort normal and breath sounds normal. No respiratory distress.  GI: Soft. She exhibits no mass. There is no hepatomegaly. There is no abdominal tenderness.  Musculoskeletal:        General: No edema.  Lymphadenopathy:    She has no cervical adenopathy.  Neurological: She is alert and oriented to person, place, and time. She has normal strength. No cranial nerve deficit. Gait normal.  Skin: Skin is warm. No rash noted. No erythema.  Psychiatric: She has a normal mood and affect.  Well groomed, good eye contact.       ASSESSMENT AND PLAN:  [name] was seen today for palpitations.  Diagnoses and all orders for this visit: Lab Results  Component Value Date   WBC 7.5 08/23/2018   HGB 10.0 (L) 08/23/2018   HCT 31.8 (L) 08/23/2018   MCV 74.5 (L) 08/23/2018   PLT 259.0 08/23/2018   Lab Results  Component Value Date   TSH 0.93 08/23/2018    Tachycardia -     EKG 12-Lead -     Ambulatory referral to Cardiology  Heart palpitations -     Ambulatory referral to Cardiology -  Basic metabolic panel -     TSH -     CBC  We discussed possible etiologies. Hx does not suggest a serious process. Adequate hydration. Continue monitoring HR daily.  EKG today ST,normal axis, ? LAE. No other EKG available for comparison.  Because she seems to be having chronic sinus tach + new onset palpitations + ? LAE on EKG;I think cardia work up is necessary,cardiac monitor and echo.  We discussed some pharmacologic options for sinus tach,including BB and CCB (Diltiazem) and their side effects. She prefers to hold on medications for now. Clearly instructed about warning signs. Further recommendations will be given according to lab results.   Return if symptoms worsen or fail to improve.   -[name] was advised to seek immediate medical attention if sudden worsening symptoms or to follow if they persist or if new concerns arise.     Elio Haden G. [place], MD  [facility]. [place] office.

## 2018-08-23 NOTE — Patient Instructions (Signed)
A few things to remember from today's visit:   Tachycardia - Plan: EKG 12-Lead, Ambulatory referral to Cardiology  Heart palpitations - Plan: Ambulatory referral to Cardiology  Continue monitoring heart rate.  Palpitations Palpitations are feelings that your heartbeat is not normal. Your heartbeat may feel like it is:  Uneven.  Faster than normal.  Fluttering.  Skipping a beat. This is usually not a serious problem. In some cases, you may need tests to rule out any serious problems. Follow these instructions at home: Pay attention to any changes in your condition. Take these actions to help manage your symptoms: Eating and drinking  Avoid: ? Coffee, tea, soft drinks, and energy drinks. ? Chocolate. ? Alcohol. ? Diet pills. Lifestyle   Try to lower your stress. These things can help you relax: ? Yoga. ? Deep breathing and meditation. ? Exercise. ? Using words and images to create positive thoughts (guided imagery). ? Using your mind to control things in your body (biofeedback).  Do not use drugs.  Get plenty of rest and sleep. Keep a regular bed time. General instructions   Take over-the-counter and prescription medicines only as told by your doctor.  Do not use any products that contain nicotine or tobacco, such as cigarettes and e-cigarettes. If you need help quitting, ask your doctor.  Keep all follow-up visits as told by your doctor. This is important. You may need more tests if palpitations do not go away or get worse. Contact a doctor if:  Your symptoms last more than 24 hours.  Your symptoms occur more often. Get help right away if you:  Have chest pain.  Feel short of breath.  Have a very bad headache.  Feel dizzy.  Pass out (faint). Summary  Palpitations are feelings that your heartbeat is uneven or faster than normal. It may feel like your heart is fluttering or skipping a beat.  Avoid food and drinks that may cause palpitations. These  include caffeine, chocolate, and alcohol.  Try to lower your stress. Do not smoke or use drugs.  Get help right away if you faint or have chest pain, shortness of breath, a severe headache, or dizziness. This information is not intended to replace advice given to you by your health care provider. Make sure you discuss any questions you have with your health care provider. Document Released: 11/27/2007 Document Revised: 04/01/2017 Document Reviewed: 04/01/2017 Elsevier Interactive Patient Education  2019 [employer].   Please be sure medication list is accurate. If a new problem present, please set up appointment sooner than planned today.

## 2018-08-24 LAB — CBC
HCT: 31.8 % — ABNORMAL LOW (ref 36.0–46.0)
Hemoglobin: 10 g/dL — ABNORMAL LOW (ref 12.0–15.0)
MCHC: 31.6 g/dL (ref 30.0–36.0)
MCV: 74.5 fl — ABNORMAL LOW (ref 78.0–100.0)
Platelets: 259 10*3/uL (ref 150.0–400.0)
RBC: 4.26 Mil/uL (ref 3.87–5.11)
RDW: 14.2 % (ref 11.5–15.5)
WBC: 7.5 10*3/uL (ref 4.0–10.5)

## 2018-08-24 LAB — BASIC METABOLIC PANEL
BUN: 17 mg/dL (ref 6–23)
CO2: 24 mEq/L (ref 19–32)
Calcium: 9.4 mg/dL (ref 8.4–10.5)
Chloride: 103 mEq/L (ref 96–112)
Creatinine, Ser: 0.66 mg/dL (ref 0.40–1.20)
GFR: 99.65 mL/min (ref >60.00)
Glucose, Bld: 91 mg/dL (ref 70–99)
Potassium: 3.9 mEq/L (ref 3.5–5.1)
Sodium: 136 mEq/L (ref 135–145)

## 2018-08-24 LAB — TSH: TSH: 0.93 u[IU]/mL (ref 0.35–4.50)

## 2018-08-26 ENCOUNTER — Telehealth: Payer: Self-pay

## 2018-08-26 NOTE — Telephone Encounter (Signed)
Spoke with pt regarding covid-19 screening prior to appt. Pt stated she has not been in contact with anyone who may have covid-19 and has no symptoms. 

## 2018-08-27 ENCOUNTER — Encounter: Payer: Self-pay | Admitting: Internal Medicine

## 2018-08-27 ENCOUNTER — Other Ambulatory Visit: Payer: Self-pay

## 2018-08-27 ENCOUNTER — Ambulatory Visit (INDEPENDENT_AMBULATORY_CARE_PROVIDER_SITE_OTHER): Payer: 59 | Admitting: Internal Medicine

## 2018-08-27 VITALS — BP 108/70 | HR 108 | Ht 66.0 in | Wt 142.0 lb

## 2018-08-27 DIAGNOSIS — R002 Palpitations: Secondary | ICD-10-CM | POA: Diagnosis not present

## 2018-08-27 DIAGNOSIS — R000 Tachycardia, unspecified: Secondary | ICD-10-CM

## 2018-08-27 NOTE — Patient Instructions (Signed)
Medication Instructions:  Your physician recommends that you continue on your current medications as directed. Please refer to the Current Medication list given to you today.  Labwork: None ordered.  Testing/Procedures: Your physician has requested that you have an echocardiogram. Echocardiography is a painless test that uses sound waves to create images of your heart. It provides your doctor with information about the size and shape of your heart and how well your heart's chambers and valves are working. This procedure takes approximately one hour. There are no restrictions for this procedure.  Please schedule for ECHO  Your physician has recommended that you wear a holter monitor. Holter monitors are medical devices that record the heart's electrical activity. Doctors most often use these monitors to diagnose arrhythmias. Arrhythmias are problems with the speed or rhythm of the heartbeat. The monitor is a small, portable device. You can wear one while you do your normal daily activities. This is usually used to diagnose what is causing palpitations/syncope (passing out).  14 day zio monitor  Follow-Up: Your physician wants you to follow-up in: as needed with Dr. Rayann Heman.     Any Other Special Instructions Will Be Listed Below (If Applicable).  If you need a refill on your cardiac medications before your next appointment, please call your pharmacy.

## 2018-08-27 NOTE — Progress Notes (Signed)
Electrophysiology Office Note Date: 08/27/2018  ID:  Cheryl, Stein 05-15-1980, MRN [id number]  PCP: Billie Ruddy, MD Electrophysiologist: Rayann Heman  CC: palpitations  Cheryl Stein is a 38 y.o. female referred today by Betty Martinique for evaluation of palpitations.  She reports a longstanding history of resting tachycardia with rates frequently above 100. She gives blood every 8 weeks and often has to sit for a few minutes to get her resting heart rate below 100 to be able to give blood.  She power walks on the treadmill 30 minutes a day without symptoms and gets her heart rate up to 150bpm.  For the last 3 weeks, she has had periods of daily palpitations. These feel like "flutters" and last for seconds at a time. They occasionally occur in clusters. Her family has a portable EKG and she has used it intermittently. She has not been able to capture any of her palpitations with this yet. Past medical history is notable for a hypercoagulable disorder for which she takes Heparin after ovulation.   She denies chest pain, dyspnea, PND, orthopnea, nausea, vomiting, dizziness, syncope, edema, weight gain, or early satiety.  Past Medical History:  Diagnosis Date  . Allergy   . AMA (advanced maternal age) multigravida 54+   . Anemia    periodic  . Anxiety   . Clotting disorder ([place])    to prevent miscarage  . Endometriosis   . Family history of adverse reaction to anesthesia    mother has PONV  . GERD (gastroesophageal reflux disease)   . History of multiple miscarriages   . Hx of varicella   . Low blood pressure reading   . 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 CESAREAN SECTION  12/21/2010   Procedure: CESAREAN SECTION;  Surgeon: Luz Lex, MD;  Location: [facility];  Service: Gynecology;  Laterality: N/A;  Primary Cesarean Section with birth of baby boy @ 65  .  CHOLECYSTECTOMY N/A 08/18/2016   Procedure: LAPAROSCOPIC CHOLECYSTECTOMY WITH INTRAOPERATIVE CHOLANGIOGRAM;  Surgeon: Alphonsa Overall, MD;  Location: [facility];  Service: General;  Laterality: N/A;  . DILATION AND EVACUATION    . DILATION AND EVACUATION N/A 02/15/2016   Procedure: DILATATION AND EVACUATION (D&E) 2ND TRIMESTER WITH GENETIC STUDIES;  Surgeon: Louretta Shorten, MD;  Location: [facility];  Service: Gynecology;  Laterality: N/A;  ok per [place] for this date and time  . HYMENECTOMY    . LAPAROSCOPY    . WISDOM TOOTH EXTRACTION      Current Outpatient Medications  Medication Sig Dispense Refill  . heparin 5000 UNIT/ML injection Inject 5,000 Units into the skin every 8 (eight) hours. 7 days out of the month after ovulation    . ondansetron (ZOFRAN) 4 MG tablet Take 1 tablet (4 mg total) by mouth every 8 (eight) hours as needed for nausea or vomiting. 30 tablet 2  . pantoprazole (PROTONIX) 40 MG tablet Take 1 tablet (40 mg total) by mouth daily. 90 tablet 3  . Prenatal Vit-Fe Fumarate-FA (PRENATAL MULTIVITAMIN) TABS tablet Take 1 tablet by mouth daily at 12 noon.     No current facility-administered medications for this visit.     Allergies:   Codeine and Latex   Social History: Social History   Socioeconomic History  . Marital status: Married    Spouse name: Not on file  . Number of children: Not on  file  . Years of education: Not on file  . Highest education level: Not on file  Occupational History  . Not on file  Social Needs  . Financial resource strain: Not on file  . Food insecurity    Worry: Not on file    Inability: Not on file  . Transportation needs    Medical: Not on file    Non-medical: Not on file  Tobacco Use  . Smoking status: Never Smoker  . Smokeless tobacco: Never Used  Substance and Sexual Activity  . Alcohol use: Yes    Comment: occ  . Drug use: No  . Sexual activity: Yes    Comment: pt said she knows she is not pregnant  Lifestyle  . Physical activity     Days per week: Not on file    Minutes per session: Not on file  . Stress: Not on file  Relationships  . Social Herbalist on phone: Not on file    Gets together: Not on file    Attends religious service: Not on file    Active member of club or organization: Not on file    Attends meetings of clubs or organizations: Not on file    Relationship status: Not on file  . Intimate partner violence    Fear of current or ex partner: Not on file    Emotionally abused: Not on file    Physically abused: Not on file    Forced sexual activity: Not on file  Other Topics Concern  . Not on file  Social History Narrative  . Not on file    Family History: Family History  Problem Relation Age of Onset  . Hypertension Mother   . Parkinson's disease Father   . Birth defects Brother        anacephaly  . Diabetes Maternal Grandfather   . Diabetes Paternal Grandmother   . Colon cancer Neg Hx   . Esophageal cancer Neg Hx   . Rectal cancer Neg Hx   . Stomach cancer Neg Hx     Review of Systems: All other systems reviewed and are otherwise negative except as noted above.   Physical Exam: VS:  BP 108/70   Pulse (!) 108   Ht 5\' 6"  (1.676 m)   Wt 142 lb (64.4 kg)   SpO2 99%   BMI 22.92 kg/m  , BMI Body mass index is 22.92 kg/m. Wt Readings from Last 3 Encounters:  08/27/18 142 lb (64.4 kg)  08/23/18 145 lb 3.2 oz (65.9 kg)  03/17/18 137 lb (62.1 kg)    GEN- The patient is well appearing, alert and oriented x 3 today.   HEENT: normocephalic, atraumatic; sclera clear, conjunctiva pink; hearing intact; oropharynx clear; neck supple  Lungs- Clear to ausculation bilaterally, normal work of breathing.  No wheezes, rales, rhonchi Heart- Tachycardic regular rate and rhythm  GI- soft, non-tender, non-distended, bowel sounds present  Extremities- no clubbing, cyanosis, or edema  MS- no significant deformity or atrophy Skin- warm and dry, no rash or lesion  Psych- euthymic mood,  full affect Neuro- strength and sensation are intact   EKG:  EKG is ordered today. The ekg ordered today shows sinus tachycardia, rate 108, normal intervals   Recent Labs: 12/08/2017: ALT 11 08/23/2018: BUN 17; Creatinine, Ser 0.66; Hemoglobin 10.0; Platelets 259.0; Potassium 3.9; Sodium 136; TSH 0.93    Other studies Reviewed: Additional studies/ records that were reviewed today include: PCP records and labs  Assessment and Plan:  1.  Palpitations Unclear cause Will further evaluate with echo and 14 day monitor  Recent lab work reviewed  2. Sinus tachycardia Likely reactive Adequate hydration, salt liberalization, heat avoidance encouraged  3. Anemia Discussed today   Current medicines are reviewed at length with the patient today.   The patient does not have concerns regarding her medicines.  The following changes were made today:  none  Labs/ tests ordered today include: echo, 14 day monitor  Orders Placed This Encounter  Procedures  . LONG TERM MONITOR (3-14 DAYS)  . EKG 12-Lead  . ECHOCARDIOGRAM COMPLETE     Disposition:   Follow up with EP as needed by phone after test results back    Signed, Thompson Grayer, MD  08/27/2018 12:39 PM   [facility] [address] [place] [address] [phone] (office) [phone] (fax)

## 2018-08-30 ENCOUNTER — Telehealth: Payer: Self-pay | Admitting: *Deleted

## 2018-08-30 NOTE — Telephone Encounter (Signed)
[employer] to ship a 14 day long term holter monitor to the patients home.  Patient will be on vacation 7/1-7/11.  She can apply the monitor after her vacation.  She will be able to remove her monitor for her Echocardiogram on 7/13, as [employer] supplies additional adhesive strips for monitor.  Instructions reviewed briefly as the are included in monitor kit.

## 2018-09-10 ENCOUNTER — Telehealth: Payer: 59 | Admitting: Cardiology

## 2018-09-13 ENCOUNTER — Ambulatory Visit (INDEPENDENT_AMBULATORY_CARE_PROVIDER_SITE_OTHER): Payer: 59

## 2018-09-13 ENCOUNTER — Other Ambulatory Visit: Payer: Self-pay

## 2018-09-13 ENCOUNTER — Ambulatory Visit (HOSPITAL_COMMUNITY): Payer: 59 | Attending: Internal Medicine

## 2018-09-13 DIAGNOSIS — R404 Transient alteration of awareness: Secondary | ICD-10-CM | POA: Diagnosis not present

## 2018-09-13 DIAGNOSIS — R002 Palpitations: Secondary | ICD-10-CM

## 2018-10-19 ENCOUNTER — Other Ambulatory Visit: Payer: Self-pay | Admitting: Internal Medicine

## 2018-10-19 DIAGNOSIS — R404 Transient alteration of awareness: Secondary | ICD-10-CM

## 2018-10-19 DIAGNOSIS — R002 Palpitations: Secondary | ICD-10-CM

## 2019-01-22 ENCOUNTER — Other Ambulatory Visit: Payer: Self-pay | Admitting: Internal Medicine

## 2019-06-12 ENCOUNTER — Emergency Department (HOSPITAL_COMMUNITY): Payer: 59

## 2019-06-12 ENCOUNTER — Encounter (HOSPITAL_COMMUNITY): Payer: Self-pay | Admitting: *Deleted

## 2019-06-12 ENCOUNTER — Emergency Department (HOSPITAL_COMMUNITY)
Admission: EM | Admit: 2019-06-12 | Discharge: 2019-06-12 | Disposition: A | Discharge status: Home / Self Care | Payer: 59 | Attending: Emergency Medicine | Admitting: Emergency Medicine

## 2019-06-12 ENCOUNTER — Other Ambulatory Visit: Payer: Self-pay

## 2019-06-12 DIAGNOSIS — U071 COVID-19: Secondary | ICD-10-CM | POA: Diagnosis not present

## 2019-06-12 DIAGNOSIS — R509 Fever, unspecified: Secondary | ICD-10-CM | POA: Insufficient documentation

## 2019-06-12 DIAGNOSIS — R1031 Right lower quadrant pain: Secondary | ICD-10-CM | POA: Insufficient documentation

## 2019-06-12 DIAGNOSIS — R109 Unspecified abdominal pain: Secondary | ICD-10-CM | POA: Diagnosis present

## 2019-06-12 DIAGNOSIS — Z79899 Other long term (current) drug therapy: Secondary | ICD-10-CM | POA: Insufficient documentation

## 2019-06-12 DIAGNOSIS — Z7901 Long term (current) use of anticoagulants: Secondary | ICD-10-CM | POA: Diagnosis not present

## 2019-06-12 LAB — URINALYSIS, MICROSCOPIC (REFLEX): RBC / HPF: >50 RBC/hpf (ref 0–5)

## 2019-06-12 LAB — COMPREHENSIVE METABOLIC PANEL
ALT: 195 U/L — ABNORMAL HIGH (ref 0–44)
AST: 64 U/L — ABNORMAL HIGH (ref 15–41)
Albumin: 3.9 g/dL (ref 3.5–5.0)
Alkaline Phosphatase: 100 U/L (ref 38–126)
Anion gap: 8 (ref 5–15)
BUN: 10 mg/dL (ref 6–20)
CO2: 24 mmol/L (ref 22–32)
Calcium: 8.9 mg/dL (ref 8.9–10.3)
Chloride: 107 mmol/L (ref 98–111)
Creatinine, Ser: 0.75 mg/dL (ref 0.44–1.00)
GFR calc Af Amer: >60 mL/min (ref >60)
GFR calc non Af Amer: >60 mL/min (ref >60)
Glucose, Bld: 93 mg/dL (ref 70–99)
Potassium: 3.7 mmol/L (ref 3.5–5.1)
Sodium: 139 mmol/L (ref 135–145)
Total Bilirubin: 0.7 mg/dL (ref 0.3–1.2)
Total Protein: 7.1 g/dL (ref 6.5–8.1)

## 2019-06-12 LAB — URINALYSIS, ROUTINE W REFLEX MICROSCOPIC
Bilirubin Urine: NEGATIVE
Glucose, UA: NEGATIVE mg/dL
Ketones, ur: NEGATIVE mg/dL
Nitrite: NEGATIVE
Protein, ur: NEGATIVE mg/dL
Specific Gravity, Urine: 1.01 (ref 1.005–1.030)
pH: 6.5 (ref 5.0–8.0)

## 2019-06-12 LAB — CBC
HCT: 37.2 % (ref 36.0–46.0)
Hemoglobin: 12 g/dL (ref 12.0–15.0)
MCH: 27.7 pg (ref 26.0–34.0)
MCHC: 32.3 g/dL (ref 30.0–36.0)
MCV: 85.9 fL (ref 80.0–100.0)
Platelets: 209 10*3/uL (ref 150–400)
RBC: 4.33 MIL/uL (ref 3.87–5.11)
RDW: 12.4 % (ref 11.5–15.5)
WBC: 3.6 10*3/uL — ABNORMAL LOW (ref 4.0–10.5)
nRBC: 0 % (ref 0.0–0.2)

## 2019-06-12 LAB — I-STAT BETA HCG BLOOD, ED (MC, WL, AP ONLY): I-stat hCG, quantitative: <5 m[IU]/mL (ref <5)

## 2019-06-12 LAB — LIPASE, BLOOD: Lipase: 26 U/L (ref 11–51)

## 2019-06-12 LAB — SARS CORONAVIRUS 2 (TAT 6-24 HRS): SARS Coronavirus 2: POSITIVE — AB

## 2019-06-12 MED ORDER — SODIUM CHLORIDE (PF) 0.9 % IJ SOLN
INTRAMUSCULAR | Status: AC
  Filled 2019-06-12: qty 50

## 2019-06-12 MED ORDER — SODIUM CHLORIDE 0.9% FLUSH
3.0000 mL | Freq: Once | INTRAVENOUS | Status: AC
  Administered 2019-06-12: 06:00:00 3 mL via INTRAVENOUS

## 2019-06-12 MED ORDER — ONDANSETRON HCL 4 MG/2ML IJ SOLN
4.0000 mg | Freq: Once | INTRAMUSCULAR | Status: AC
  Administered 2019-06-12: 4 mg via INTRAVENOUS
  Filled 2019-06-12: qty 2

## 2019-06-12 MED ORDER — KETOROLAC TROMETHAMINE 15 MG/ML IJ SOLN
15.0000 mg | Freq: Once | INTRAMUSCULAR | Status: AC
  Administered 2019-06-12: 15 mg via INTRAVENOUS
  Filled 2019-06-12: qty 1

## 2019-06-12 MED ORDER — SODIUM CHLORIDE 0.9 % IV BOLUS
1000.0000 mL | Freq: Once | INTRAVENOUS | Status: AC
  Administered 2019-06-12: 1000 mL via INTRAVENOUS

## 2019-06-12 MED ORDER — IOHEXOL 300 MG/ML  SOLN
100.0000 mL | Freq: Once | INTRAMUSCULAR | Status: AC | PRN
  Administered 2019-06-12: 08:00:00 100 mL via INTRAVENOUS

## 2019-06-12 NOTE — ED Provider Notes (Signed)
[facility] DEPT Provider Note   CSN: [id number] Arrival date & time: 06/12/19  0511     History Chief Complaint  Patient presents with  . Abdominal Pain    Cheryl Stein is a 39 y.o. female.  39 y.o female with a PMH of endometriosis presents to the ED with a chief complaint of lower abdominal pain x 36 hours. Patient describes pain as intermittent burning sensation localized to the right lower quadrant. She reports pain had been ongoing for the past day but the pay exacerbated this morning when it woke her up from her sleep. She has taken hydrocodone for her pain which has been keeping the symptoms at [place], but reports after medication wears off symptoms return.  Patient has not tried applying heating pad to the area without any improvement.  She does report a longstanding history of endometriosis, ovarian cyst, reports pain felt different than this.  She currently lives with her husband who had a positive COVID-19 test this past week, she reports being symptomatic beginning on Tuesday.  Has run a fever with a T-max of 99.8, has had decrease in appetite, myalgias and diarrhea.  Of note, patient is currently on heparin shots for fertility treatment.  She reports her last shot was taken yesterday in the morning.  States that she began her menses while in the ED today.  Does have a prior history surgical history of cholecystectomy, C-section, laparoscopy procedures.  The history is provided by the patient.  Abdominal Pain Pain location:  RLQ Pain quality: burning   Pain radiates to:  Does not radiate Pain severity:  Mild Onset quality:  Sudden Duration:  2 days Timing:  Constant Progression:  Worsening Chronicity:  New Context: awakening from sleep and sick contacts   Context: not recent illness, not recent travel and not suspicious food intake   Associated symptoms: chills, diarrhea and fever   Associated symptoms: no chest pain, no nausea, no  shortness of breath, no sore throat and no vomiting   Risk factors: multiple surgeries   Risk factors: not pregnant        Past Medical History:  Diagnosis Date  . Allergy   . AMA (advanced maternal age) multigravida 73+   . Anemia    periodic  . Anxiety   . Clotting disorder ([place])    to prevent miscarage  . Endometriosis   . Family history of adverse reaction to anesthesia    mother has PONV  . GERD (gastroesophageal reflux disease)   . History of multiple miscarriages   . Hx of varicella   . Low blood pressure reading   . 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 Palpitations 08/27/2018  . Encounter for trial of labor 09/18/2014    Past Surgical History:  Procedure Laterality Date  . CESAREAN SECTION  12/21/2010   Procedure: CESAREAN SECTION;  Surgeon: Luz Lex, MD;  Location: [facility];  Service: Gynecology;  Laterality: N/A;  Primary Cesarean Section with birth of baby boy @ 31  . CHOLECYSTECTOMY N/A 08/18/2016   Procedure: LAPAROSCOPIC CHOLECYSTECTOMY WITH INTRAOPERATIVE CHOLANGIOGRAM;  Surgeon: Alphonsa Overall, MD;  Location: [facility];  Service: General;  Laterality: N/A;  . DILATION AND EVACUATION    . DILATION AND EVACUATION N/A 02/15/2016   Procedure: DILATATION AND EVACUATION (D&E) 2ND TRIMESTER WITH GENETIC STUDIES;  Surgeon: Louretta Shorten, MD;  Location:  [facility];  Service: Gynecology;  Laterality: N/A;  ok per [place] for this date and time  . HYMENECTOMY    . LAPAROSCOPY    . WISDOM TOOTH EXTRACTION       OB History    Gravida  6   Para  2   Term  2   Preterm  0   AB  3   Living  2     SAB  3   TAB  0   Ectopic  0   Multiple  0   Live Births  2           Family History  Problem Relation Age of Onset  . Hypertension Mother   . Parkinson's disease Father   . Birth defects Brother        anacephaly  . Diabetes Maternal Grandfather   . Diabetes  Paternal Grandmother   . Colon cancer Neg Hx   . Esophageal cancer Neg Hx   . Rectal cancer Neg Hx   . Stomach cancer Neg Hx     Social History   Tobacco Use  . Smoking status: Never Smoker  . Smokeless tobacco: Never Used  Substance Use Topics  . Alcohol use: Yes    Comment: occ  . Drug use: No    Home Medications Prior to Admission medications   Medication Sig Start Date End Date Taking? Authorizing Provider  Ascorbic Acid (VITAMIN C) 1000 MG tablet Take 1,000 mg by mouth daily.   Yes [provider]  cetirizine (ZYRTEC) 10 MG tablet Take 10 mg by mouth daily.   Yes [provider]  heparin 5000 UNIT/ML injection Inject 5,000 Units into the skin in the morning and at bedtime. 7 days out of the month after ovulation    Yes [provider]  HYDROcodone-acetaminophen (NORCO/VICODIN) 5-325 MG tablet Take 0.5 tablets by mouth every 6 (six) hours as needed for moderate pain or severe pain.    Yes [provider]  ondansetron (ZOFRAN) 4 MG tablet Take 1 tablet (4 mg total) by mouth every 8 (eight) hours as needed for nausea or vomiting. NEEDS OFFICE VISIT FOR FURTHER REFILLS 01/24/19  Yes Pyrtle, Lajuan Lines, MD  Zinc Sulfate (ZINC 15 PO) Take 15 mg by mouth daily.   Yes [provider]  pantoprazole (PROTONIX) 40 MG tablet Take 1 tablet (40 mg total) by mouth daily. Patient not taking: Reported on 06/12/2019 01/12/18   Jerene Bears, MD    Allergies    Codeine and Latex  Review of Systems   Review of Systems  Constitutional: Positive for chills and fever.  HENT: Negative for sore throat.   Respiratory: Negative for shortness of breath.   Cardiovascular: Negative for chest pain and leg swelling.  Gastrointestinal: Positive for abdominal pain and diarrhea. Negative for nausea and vomiting.  Genitourinary: Negative for difficulty urinating and flank pain.  Musculoskeletal: Positive for myalgias. Negative for back pain.  Skin: Negative for  pallor and wound.  Neurological: Negative for syncope, light-headedness and headaches.  All other systems reviewed and are negative.   Physical Exam Updated Vital Signs BP 115/87   Pulse 83   Temp 98.1 F (36.7 C) (Oral)   Resp 18   SpO2 99%   Physical Exam Vitals and nursing note reviewed.  Constitutional:      General: She is not in acute distress.    Appearance: She is well-developed.     Comments: No now appearing, nontoxic.  HENT:  Head: Normocephalic and atraumatic.     Mouth/Throat:     Pharynx: No oropharyngeal exudate.  Eyes:     Pupils: Pupils are equal, round, and reactive to light.  Cardiovascular:     Rate and Rhythm: Regular rhythm.     Heart sounds: Normal heart sounds.  Pulmonary:     Effort: Pulmonary effort is normal. No respiratory distress.     Breath sounds: Normal breath sounds.     Comments: Lungs are clear to auscultation without any wheezing, rhonchi, rales. Abdominal:     General: Bowel sounds are normal. There is no distension.     Palpations: Abdomen is soft.     Tenderness: There is no abdominal tenderness. There is no right CVA tenderness or left CVA tenderness.     Comments: Mildly tender to palpation along the right lower quadrant.  Bowel sounds are slightly diminished.  No CVA tenderness bilaterally.  Musculoskeletal:        General: No tenderness or deformity.     Cervical back: Normal range of motion.     Right lower leg: No edema.     Left lower leg: No edema.  Skin:    General: Skin is warm and dry.  Neurological:     Mental Status: She is alert and oriented to person, place, and time.     ED Results / Procedures / Treatments   Labs (all labs ordered are listed, but only abnormal results are displayed) Labs Reviewed  COMPREHENSIVE METABOLIC PANEL - Abnormal; Notable for the following components:      Result Value   AST 64 (*)    ALT 195 (*)    All other components within normal limits  CBC - Abnormal; Notable for the  following components:   WBC 3.6 (*)    All other components within normal limits  URINALYSIS, ROUTINE W REFLEX MICROSCOPIC - Abnormal; Notable for the following components:   Color, Urine STRAW (*)    APPearance HAZY (*)    Hgb urine dipstick LARGE (*)    Leukocytes,Ua SMALL (*)    All other components within normal limits  URINALYSIS, MICROSCOPIC (REFLEX) - Abnormal; Notable for the following components:   Bacteria, UA RARE (*)    All other components within normal limits  SARS CORONAVIRUS 2 (TAT 6-24 HRS)  LIPASE, BLOOD  I-STAT BETA HCG BLOOD, ED ([facility], [facility], [facility] ONLY)    EKG None  Radiology CT ABDOMEN PELVIS W CONTRAST  Result Date: 06/12/2019 CLINICAL DATA:  RIGHT lower quadrant abdominal pain. EXAM: CT ABDOMEN AND PELVIS WITH CONTRAST TECHNIQUE: Multidetector CT imaging of the abdomen and pelvis was performed using the standard protocol following bolus administration of intravenous contrast. CONTRAST:  161mL OMNIPAQUE IOHEXOL 300 MG/ML  SOLN COMPARISON:  CT 04/05/2018 FINDINGS: Lower chest: 3 mm nodule in the RIGHT lower lobe (image 10/6). Nodule clearly appreciated on comparison CT. Hepatobiliary: No focal hepatic lesion. Postcholecystectomy. No biliary dilatation. Pancreas: Pancreas is normal. No ductal dilatation. No pancreatic inflammation. Spleen: Normal spleen Adrenals/urinary tract: Adrenal glands and kidneys are normal. The ureters and bladder normal. Stomach/Bowel: Small hiatal hernia. Stomach, small bowel and terminal ileum normal. Appendix normal (image 57/2). Ascending and transverse colon normal. Descending colon rectosigmoid colon normal. Vascular/Lymphatic: Abdominal aorta is normal caliber. No periportal or retroperitoneal adenopathy. No pelvic adenopathy. Reproductive: Uterus and ovaries normal. Other: No free fluid. Musculoskeletal: No aggressive osseous lesion. IMPRESSION: 1. Normal appendix. 2. Postcholecystectomy. 3. No ureterolithiasis or obstructive uropathy. 4. Small  RIGHT lower lobe  pulmonary nodule is new from comparison exam therefore recommend follow-up noncontrast chest CT in 6 months. Electronically Signed   By: Suzy Bouchard M.D.   On: 06/12/2019 08:56   US PELVIC COMPLETE W TRANSVAGINAL AND TORSION R/O  Result Date: 06/12/2019 CLINICAL DATA:  Right lower quadrant pelvic pain for 3 days. EXAM: TRANSABDOMINAL AND TRANSVAGINAL ULTRASOUND OF PELVIS DOPPLER ULTRASOUND OF OVARIES TECHNIQUE: Both transabdominal and transvaginal ultrasound examinations of the pelvis were performed. Transabdominal technique was performed for global imaging of the pelvis including uterus, ovaries, adnexal regions, and pelvic cul-de-sac. It was necessary to proceed with endovaginal exam following the transabdominal exam to visualize the endometrium and ovaries. Color and duplex Doppler ultrasound was utilized to evaluate blood flow to the ovaries. COMPARISON:  None. FINDINGS: Uterus Measurements: 6.5 x 4.1 x 4.5 cm = volume: 63.4 mL. No fibroids or other mass visualized. Endometrium Thickness: 7 mm.  No focal abnormality visualized. Right ovary Measurements: 3.3 x 2.3 x 2.0 cm = volume: 7.9 mL. Normal appearance/no adnexal mass. Left ovary Measurements: 2.8 x 1.8 x 1.6 cm = volume: 4.2 mL. Normal appearance/no adnexal mass. Pulsed Doppler evaluation of both ovaries demonstrates normal low-resistance arterial and venous waveforms. Other findings There is a small to moderate amount of mildly complicated fluid in the pelvis. IMPRESSION: 1. The uterus, endometrium, and ovaries are normal in appearance. 2. There is a small to moderate amount of mildly complicated fluid in the pelvis which may be physiologic. Given the mildly complicated nature of the fluid, blood products from a ruptured follicle are a possibility. Recommend clinical correlation. Electronically Signed   By: Dorise Bullion III M.D   On: 06/12/2019 12:12    Procedures Procedures (including critical care time)  Medications  Ordered in ED Medications  sodium chloride flush (NS) 0.9 % injection 3 mL (3 mLs Intravenous Given 06/12/19 0556)  sodium chloride 0.9 % bolus 1,000 mL (0 mLs Intravenous Stopped 06/12/19 1016)  ondansetron (ZOFRAN) injection 4 mg (4 mg Intravenous Given 06/12/19 0800)  iohexol (OMNIPAQUE) 300 MG/ML solution 100 mL (100 mLs Intravenous Contrast Given 06/12/19 0823)  ketorolac (TORADOL) 15 MG/ML injection 15 mg (15 mg Intravenous Given 06/12/19 1028)    ED Course  I have reviewed the triage vital signs and the nursing notes.  Pertinent labs & imaging results that were available during my care of the patient were reviewed by me and considered in my medical decision making (see chart for details).  Clinical Course as of Jun 12 1254  Sun Jun 12, 2019  0730 AST(!): 64 [JS]  0731 ALT(!): 195 [JS]    Clinical Course User Index [JS] Janeece Fitting, PA-C   MDM Rules/Calculators/A&P   Patient with a past medical history of endometriosis presents to the ED with complaints of right lower quadrant pain which began 36 hours ago.  Has been taking hydrocodone for symptomatic relief with improvement in symptoms, reports symptoms return while on medication wears off.  She also has been dealing with a COVID-19 diagnosis since Tuesday, reports fevers at home with a T-max of 99.8, has had decrease in appetite, attributes this to COVID-19 infection.  She is currently going under treatment for infertility, reports her last heparin shot was yesterday.  Patient began her menses while in the ED on today's visit.  During evaluation there is mild tenderness to palpation along the right lower quadrant, reports more so of the lower abdominal pain.  No upper abdominal pain on my exam.  She is in no distress,  vitals are within normal limits, she is afebrile on arrival.  Differential diagnoses included but not limited to appendicitis, ovarian torsion, nephrolithiasis.  She is also Covid positive, has been taking hydrocodone  half of a pill while at home for symptomatic control. Interpretation of her labs showed a CBC with some leukopenia, no signs of anemia.  CMP without any electrolyte abnormality.  LFTs are elevated on today's visit, no history of alcohol abuse, did have her gallbladder removed.  She has been taking hydrocodone for symptomatic control.  She is currently experiencing Covid symptoms, husband is currently Covid positive while at home.  Will obtain a Covid send out for our records.  Suspect elevation of her enzymes likely due to Covid infection.  UA with large hemoglobin, patient began her menses on today's visit.  Specimen is likely contaminated with 6-10 squamous.  Not have any urinary symptoms, lower suspicion for renal colic. CT Abdomen:  1. Normal appendix.  2. Postcholecystectomy.  3. No ureterolithiasis or obstructive uropathy.  4. Small RIGHT lower lobe pulmonary nodule is new from comparison  exam therefore recommend follow-up noncontrast chest CT in 6 months.     She does have an extensive history of endometriosis, laparoscopy procedures, we discussed ultrasound risks and benefits due to her current IVF treatments, along with clotting disorder.  Ultrasound pelvic showed: 1. The uterus, endometrium, and ovaries are normal in appearance.  2. There is a small to moderate amount of mildly complicated fluid  in the pelvis which may be physiologic. Given the mildly complicated  nature of the fluid, blood products from a ruptured follicle are a  possibility. Recommend clinical correlation.     These results were discussed with patient at length.  We discussed follow-up for her pulmonary nodule, which patient review via MyChart.  She is provided with a copy of her CT along with ultrasound to follow-up with her PCP and gynecologist.  We will also encourage patient to follow-up on LFTs as they are elevated on today's visit, she is to discontinue hydrocodone and try ibuprofen instead.  Return  precautions discussed at length.  Patient stable for discharge.   Portions of this note were generated with Lobbyist. Dictation errors may occur despite best attempts at proofreading.  Final Clinical Impression(s) / ED Diagnoses Final diagnoses:  Right lower quadrant abdominal pain    Rx / DC Orders ED Discharge Orders    None       Janeece Fitting, Hershal Coria 06/12/19 1256    Milton Ferguson, MD 06/13/19 [phone]

## 2019-06-12 NOTE — ED Notes (Signed)
An After Visit Summary was printed and given to the patient. Discharge instructions given and no further questions at this time.  

## 2019-06-12 NOTE — Discharge Instructions (Addendum)
Your CT results were within normal limits today.  The ultrasound of your pelvis was within normal limits as well.  Both copies of the studies were provided to you.  We discussed follow-up for the pulmonary nodule that was found on your exam.  You will likely need to schedule an appointment with your primary care physician after Covid infection to reevaluate your liver enzymes.  Please attempt to discontinue Tylenol, and switch to ibuprofen instead to help improve the elevation in your liver enzymes.

## 2019-06-12 NOTE — ED Notes (Signed)
Pt went to restroom to provide urine sample and states that she just started her menses and does not know if the pain is related to the same.

## 2019-06-12 NOTE — ED Triage Notes (Signed)
Right lower abdominal pain. No urinary pain or vaginal symptoms. Fever and diarrhea 2 days ago. Last hydrocodone around 0430   (pt husband is currently COVID +)

## 2019-09-08 ENCOUNTER — Ambulatory Visit (INDEPENDENT_AMBULATORY_CARE_PROVIDER_SITE_OTHER): Payer: 59 | Admitting: Family Medicine

## 2019-09-08 ENCOUNTER — Encounter: Payer: Self-pay | Admitting: Family Medicine

## 2019-09-08 ENCOUNTER — Telehealth: Payer: Self-pay | Admitting: Family Medicine

## 2019-09-08 ENCOUNTER — Other Ambulatory Visit (INDEPENDENT_AMBULATORY_CARE_PROVIDER_SITE_OTHER): Payer: 59

## 2019-09-08 ENCOUNTER — Other Ambulatory Visit: Payer: Self-pay

## 2019-09-08 VITALS — BP 110/68 | HR 84 | Temp 97.9°F | Ht 66.0 in | Wt 148.2 lb

## 2019-09-08 DIAGNOSIS — R7989 Other specified abnormal findings of blood chemistry: Secondary | ICD-10-CM

## 2019-09-08 DIAGNOSIS — Z0000 Encounter for general adult medical examination without abnormal findings: Secondary | ICD-10-CM | POA: Diagnosis not present

## 2019-09-08 DIAGNOSIS — Z13220 Encounter for screening for lipoid disorders: Secondary | ICD-10-CM

## 2019-09-08 DIAGNOSIS — H6122 Impacted cerumen, left ear: Secondary | ICD-10-CM

## 2019-09-08 DIAGNOSIS — Z131 Encounter for screening for diabetes mellitus: Secondary | ICD-10-CM

## 2019-09-08 DIAGNOSIS — R911 Solitary pulmonary nodule: Secondary | ICD-10-CM

## 2019-09-08 LAB — COMPREHENSIVE METABOLIC PANEL
ALT: 11 U/L (ref 0–35)
AST: 14 U/L (ref 0–37)
Albumin: 4.6 g/dL (ref 3.5–5.2)
Alkaline Phosphatase: 40 U/L (ref 39–117)
BUN: 13 mg/dL (ref 6–23)
CO2: 26 mEq/L (ref 19–32)
Calcium: 9.6 mg/dL (ref 8.4–10.5)
Chloride: 103 mEq/L (ref 96–112)
Creatinine, Ser: 0.82 mg/dL (ref 0.40–1.20)
GFR: 77.16 mL/min (ref >60.00)
Glucose, Bld: 86 mg/dL (ref 70–99)
Potassium: 4.2 mEq/L (ref 3.5–5.1)
Sodium: 136 mEq/L (ref 135–145)
Total Bilirubin: 0.5 mg/dL (ref 0.2–1.2)
Total Protein: 7.4 g/dL (ref 6.0–8.3)

## 2019-09-08 LAB — CBC
HCT: 39.8 % (ref 36.0–46.0)
Hemoglobin: 13.2 g/dL (ref 12.0–15.0)
MCHC: 33 g/dL (ref 30.0–36.0)
MCV: 81.3 fl (ref 78.0–100.0)
Platelets: 249 10*3/uL (ref 150.0–400.0)
RBC: 4.9 Mil/uL (ref 3.87–5.11)
RDW: 14.5 % (ref 11.5–15.5)
WBC: 7.5 10*3/uL (ref 4.0–10.5)

## 2019-09-08 LAB — LIPID PANEL
Cholesterol: 133 mg/dL (ref 0–200)
HDL: 57 mg/dL (ref >39.00)
LDL Cholesterol: 61 mg/dL (ref 0–99)
NonHDL: 75.65
Total CHOL/HDL Ratio: 2
Triglycerides: 73 mg/dL (ref 0.0–149.0)
VLDL: 14.6 mg/dL (ref 0.0–40.0)

## 2019-09-08 LAB — HEMOGLOBIN A1C: Hgb A1c MFr Bld: 5.2 % (ref 4.6–6.5)

## 2019-09-08 LAB — TSH: TSH: 1.58 u[IU]/mL (ref 0.35–4.50)

## 2019-09-08 NOTE — Telephone Encounter (Signed)
Pt is calling with a question about her lab results.  Pt is aware that the provider has not resulted them when she does someone will give her a call back.

## 2019-09-08 NOTE — Progress Notes (Signed)
Subjective:     Cheryl Stein is a 38 y.o. female and is here for a comprehensive physical exam.  Pt doing well.  Pt wants to f/u elevated LFTs and incidental finding of lung nodule noted on CT abd/pelvis on 06/12/19 while in ED for RLQ pain.  Pt s/p COVID-19 infection at time of elevated LFTs.    Social History   Socioeconomic History  . Marital status: Married    Spouse name: Not on file  . Number of children: Not on file  . Years of education: Not on file  . Highest education level: Not on file  Occupational History  . Not on file  Tobacco Use  . Smoking status: Never Smoker  . Smokeless tobacco: Never Used  Vaping Use  . Vaping Use: Never used  Substance and Sexual Activity  . Alcohol use: Yes    Comment: occ  . Drug use: No  . Sexual activity: Yes    Comment: pt said she knows she is not pregnant  Other Topics Concern  . Not on file  Social History Narrative  . Not on file   Social Determinants of Health   Financial Resource Strain:   . Difficulty of Paying Living Expenses:   Food Insecurity:   . Worried About Charity fundraiser in the Last Year:   . Arboriculturist in the Last Year:   Transportation Needs:   . Film/video editor (Medical):   Marland Kitchen Lack of Transportation (Non-Medical):   Physical Activity:   . Days of Exercise per Week:   . Minutes of Exercise per Session:   Stress:   . Feeling of Stress :   Social Connections:   . Frequency of Communication with Friends and Family:   . Frequency of Social Gatherings with Friends and Family:   . Attends Religious Services:   . Active Member of Clubs or Organizations:   . Attends Archivist Meetings:   Marland Kitchen Marital Status:   Intimate Partner Violence:   . Fear of Current or Ex-Partner:   . Emotionally Abused:   Marland Kitchen Physically Abused:   . Sexually Abused:    Health Maintenance  Topic Date Due  . Hepatitis C Screening  Never done  . TETANUS/TDAP  Never done  . PAP SMEAR-Modifier   10/30/2017  . INFLUENZA VACCINE  10/02/2019  . HIV Screening  Completed    The following portions of the patient's history were reviewed and updated as appropriate: allergies, current medications, past family history, past medical history, past social history, past surgical history and problem list.  Review of Systems Pertinent items noted in HPI and remainder of comprehensive ROS otherwise negative.   Objective:    BP 110/68 (BP Location: Right Arm, Patient Position: Sitting, Cuff Size: Normal)   Pulse 84   Temp 97.9 F (36.6 C) (Temporal)   Ht 5\' 6"  (1.676 m)   Wt 148 lb 4 oz (67.2 kg)   LMP 08/22/2019   SpO2 99%   BMI 23.93 kg/m  General appearance: alert, cooperative and no distress Head: Normocephalic, without obvious abnormality, atraumatic Eyes: conjunctivae/corneas clear. PERRL, EOM's intact. Fundi benign. Ears: L canal occluded with cerumen.  L ear irrigated, normal L canal and TM after irrigation.  Normal TM's and external ear canals L ear. Nose: Nares normal. Septum midline. Mucosa normal. No drainage or sinus tenderness. Throat: lips, mucosa, and tongue normal; teeth and gums normal Neck: no adenopathy, no carotid bruit, no JVD, supple, symmetrical,  trachea midline and thyroid not enlarged, symmetric, no tenderness/mass/nodules Lungs: clear to auscultation bilaterally Heart: regular rate and rhythm, S1, S2 normal, no murmur, click, rub or gallop Abdomen: soft, non-tender; bowel sounds normal; no masses,  no organomegaly Extremities: extremities normal, atraumatic, no cyanosis or edema Pulses: 2+ and symmetric Skin: Skin color, texture, turgor normal. No rashes or lesions Lymph nodes: Cervical, supraclavicular, and axillary nodes normal. Neurologic: Alert and oriented X 3, normal strength and tone. Normal symmetric reflexes. Normal coordination and gait    Assessment:    Healthy female exam with cerumen impaction in L ear, who needs f/u on incidental lung nodule on  CT and h/o elevated LFTs.     Plan:     Anticipatory guidance given including wearing seatbelts, smoke detectors in the home, increasing physical activity, increasing p.o. intake of water and vegetables. -will obtain labs -mammogram and pap scheduled -given handout -next CPE in 1 yr See After Visit Summary for Counseling Recommendations    Impacted cerumen of left ear  -consent obtained.  L ear irrigated.  Pt tolerated procedure well. -given handout  Elevated LFTs  -thought 2/2 h/o COVID infection -CT abd and pelvis 06/12/19 without focal hepatic lesion.  Postcholecystectomy. No biliary dilation.  Small hiatal hernia. - Plan: Comprehensive metabolic panel  Incidental lung nodule, less than or equal to 37mm  -noted on CT abd and pelvis 06/12/19: 3 mm nodule in R lower lobe - Plan: CT CHEST NODULE FOLLOW UP LOW DOSE W/O  Screening for cholesterol level  - Plan: Lipid panel  Screening for diabetes mellitus  - Plan: Hemoglobin A1c  F/u prn  Grier Mitts, MD

## 2019-09-08 NOTE — Patient Instructions (Signed)
Preventive Care 40-40 Years Old, Female Preventive care refers to visits with your health care provider and lifestyle choices that can promote health and wellness. This includes:  A yearly physical exam. This may also be called an annual well check.  Regular dental visits and eye exams.  Immunizations.  Screening for certain conditions.  Healthy lifestyle choices, such as eating a healthy diet, getting regular exercise, not using drugs or products that contain nicotine and tobacco, and limiting alcohol use. What can I expect for my preventive care visit? Physical exam Your health care provider will check your:  Height and weight. This may be used to calculate body mass index (BMI), which tells if you are at a healthy weight.  Heart rate and blood pressure.  Skin for abnormal spots. Counseling Your health care provider may ask you questions about your:  Alcohol, tobacco, and drug use.  Emotional well-being.  Home and relationship well-being.  Sexual activity.  Eating habits.  Work and work environment.  Method of birth control.  Menstrual cycle.  Pregnancy history. What immunizations do I need?  Influenza (flu) vaccine  This is recommended every year. Tetanus, diphtheria, and pertussis (Tdap) vaccine  You may need a Td booster every 10 years. Varicella (chickenpox) vaccine  You may need this if you have not been vaccinated. Zoster (shingles) vaccine  You may need this after age 60. Measles, mumps, and rubella (MMR) vaccine  You may need at least one dose of MMR if you were born in 1957 or later. You may also need a second dose. Pneumococcal conjugate (PCV13) vaccine  You may need this if you have certain conditions and were not previously vaccinated. Pneumococcal polysaccharide (PPSV23) vaccine  You may need one or two doses if you smoke cigarettes or if you have certain conditions. Meningococcal conjugate (MenACWY) vaccine  You may need this if you  have certain conditions. Hepatitis A vaccine  You may need this if you have certain conditions or if you travel or work in places where you may be exposed to hepatitis A. Hepatitis B vaccine  You may need this if you have certain conditions or if you travel or work in places where you may be exposed to hepatitis B. Haemophilus influenzae type b (Hib) vaccine  You may need this if you have certain conditions. Human papillomavirus (HPV) vaccine  If recommended by your health care provider, you may need three doses over 6 months. You may receive vaccines as individual doses or as more than one vaccine together in one shot (combination vaccines). Talk with your health care provider about the risks and benefits of combination vaccines. What tests do I need? Blood tests  Lipid and cholesterol levels. These may be checked every 5 years, or more frequently if you are over 50 years old.  Hepatitis C test.  Hepatitis B test. Screening  Lung cancer screening. You may have this screening every year starting at age 55 if you have a 30-pack-year history of smoking and currently smoke or have quit within the past 15 years.  Colorectal cancer screening. All adults should have this screening starting at age 50 and continuing until age 75. Your health care provider may recommend screening at age 45 if you are at increased risk. You will have tests every 1-10 years, depending on your results and the type of screening test.  Diabetes screening. This is done by checking your blood sugar (glucose) after you have not eaten for a while (fasting). You may have this   done every 1-3 years.  Mammogram. This may be done every 1-2 years. Talk with your health care provider about when you should start having regular mammograms. This may depend on whether you have a family history of breast cancer.  BRCA-related cancer screening. This may be done if you have a family history of breast, ovarian, tubal, or peritoneal  cancers.  Pelvic exam and Pap test. This may be done every 3 years starting at age 33. Starting at age 72, this may be done every 5 years if you have a Pap test in combination with an HPV test. Other tests  Sexually transmitted disease (STD) testing.  Bone density scan. This is done to screen for osteoporosis. You may have this scan if you are at high risk for osteoporosis. Follow these instructions at home: Eating and drinking  Eat a diet that includes fresh fruits and vegetables, whole grains, lean protein, and low-fat dairy.  Take vitamin and mineral supplements as recommended by your health care provider.  Do not drink alcohol if: ? Your health care provider tells you not to drink. ? You are pregnant, may be pregnant, or are planning to become pregnant.  If you drink alcohol: ? Limit how much you have to 0-1 drink a day. ? Be aware of how much alcohol is in your drink. In the [place] one drink equals one 12 oz bottle of beer (355 mL), one 5 oz glass of wine (148 mL), or one 1 oz glass of hard liquor (44 mL). Lifestyle  Take daily care of your teeth and gums.  Stay active. Exercise for at least 30 minutes on 5 or more days each week.  Do not use any products that contain nicotine or tobacco, such as cigarettes, e-cigarettes, and chewing tobacco. If you need help quitting, ask your health care provider.  If you are sexually active, practice safe sex. Use a condom or other form of birth control (contraception) in order to prevent pregnancy and STIs (sexually transmitted infections).  If told by your health care provider, take low-dose aspirin daily starting at age 35. What's next?  Visit your health care provider once a year for a well check visit.  Ask your health care provider how often you should have your eyes and teeth checked.  Stay up to date on all vaccines. This information is not intended to replace advice given to you by your health care provider. Make sure you  discuss any questions you have with your health care provider. Document Revised: 10/29/2017 Document Reviewed: 10/29/2017 Elsevier Patient Education  2020 [employer]  Pulmonary Nodule A pulmonary nodule is a small, round growth of tissue in the lung. It is sometimes referred to as a "shadow" or "spot on the lung." Nodules range in size from less than 1/5 of an inch (4 mm) to a little bigger than an inch (30 mm). Pulmonary nodules can be either noncancerous (benign) or cancerous (malignant). Most are noncancerous. Smaller nodules in people who do not smoke and do not have any other risk factors for lung cancer are more likely to be noncancerous. Larger, irregular nodules in people who smoke or who have a strong family history of lung cancer are more likely to be cancerous. What are the causes? This condition may be caused by:  A bacterial, fungal, or viral infection, such as tuberculosis. The infection is usually an old and inactive one.  A noncancerous mass of tissue.  Inflammation from conditions such as rheumatoid arthritis.  Abnormal blood  vessels in the lungs.  Cancerous tissue, such as lung cancer or a cancer in another part of the body that has spread to the lung. What are the signs or symptoms? This condition usually does not cause symptoms. If symptoms appear, they are usually related to the underlying cause. For example, if the condition is caused by an infection, you may have a cough or fever. How is this diagnosed? This condition is usually diagnosed with an X-ray or CT scan. To help determine whether a pulmonary nodule is benign or malignant, your health care provider will:  Take your medical history.  Perform a physical exam.  Order tests, including: ? Blood tests. ? A skin test called a tuberculin test. This test is done to check if you have been exposed to the germ that causes tuberculosis. ? Chest X-rays. ? A CT scan. This test shows smaller pulmonary nodules more  clearly and with more detail than an X-ray. ? A positron emission tomography (PET) scan. This test is done to check if the nodule is cancerous. During the test, a safe amount of a radioactive substance is injected into the bloodstream. Then a picture is taken. ? Biopsy. In this test, a tiny piece of the pulmonary nodule is removed and then examined under a microscope. How is this treated? Treatment for this condition depends on whether the pulmonary nodule is malignant or benign as well as your risk of getting cancer.  Noncancerous nodules usually do not need to be treated, but they may need to be monitored with CT scans. If a CT scan shows that the pulmonary nodule got bigger, more tests may be done.  Some nodules need to be removed. If this is the case, you may have a procedure called a thoractomy. During the procedure, your health care provider will make an incision in your chest and remove the part of the lung where the nodule is located. Follow these instructions at home:   Take over-the-counter and prescription medicines only as told by your health care provider.  Do not use any products that contain nicotine or tobacco, such as cigarettes and e-cigarettes. If you need help quitting, ask your health care provider.  Keep all follow-up visits as told by your health care provider. This is important. Contact a health care provider if:  You have trouble breathing when you are active.  You feel sick or unusually tired.  You do not feel like eating.  You lose weight without trying.  You develop chills or night sweats. Get help right away if:  You cannot catch your breath.  You begin wheezing.  You cannot stop coughing.  You cough up blood.  You become dizzy or feel like you are going to faint.  You have sudden chest pain.  You have a fever or persistent symptoms for more than 2-3 days.  You have a fever and your symptoms suddenly get worse. Summary  A pulmonary nodule is  a small, round growth of tissue in the lung. Most pulmonary nodules are noncancerous.  This condition is usually diagnosed with an X-ray or CT scan.  Common causes of pulmonary nodules include infection, inflammation, and noncancerous growths.  Though less common, if a nodule is found to be cancerous, you will need specific diagnostic tests and treatment options as directed by your medical provider.  Treatment for this condition depends on whether the pulmonary nodule is benign or malignant as well as your risk of getting cancer. This information is not intended to  replace advice given to you by your health care provider. Make sure you discuss any questions you have with your health care provider. Document Revised: 03/13/2017 Document Reviewed: 03/18/2016 Elsevier Patient Education  2020 [place].  Liver Function Tests Why am I having this test? Liver function tests are done to see how well your liver is working. The proteins and enzymes measured in the test can alert your health care provider to inflammation, damage, or disease in your liver. It is common to have liver function tests:  When you are taking certain medicines.  If you have liver disease.  If you drink a lot of alcohol.  When you are not feeling well.  When you have other conditions that may affect your liver.  During annual physical exams.  If you have symptoms such as yellowing of the skin (jaundice), abdominal pain, or nausea and vomiting. What is being tested? These tests measure various substances in your blood. This may include:  Alanine transaminase (ALT). This is an enzyme in the liver.  Aspartate transaminase (AST). This is an enzyme in the liver, heart, and muscles.  Alkaline phosphatase (ALP). This is a protein in the liver, bile ducts, bone, and other body tissues.  Total bilirubin. This is a yellow pigment in bile.  Albumin. This is a protein in the liver.  Prothrombin time and international  normalized ratio (PT and INR). PT measures the time it takes for your blood to clot. INR is a calculation of blood clotting time based on your PT result. It is also calculated based on normal ranges defined by the lab that processed your test.  Total protein. This includes two proteins, albumin and globulin, found in the blood. What kind of sample is taken?  A blood sample is required for this test. It is usually collected by inserting a needle into a blood vessel. How do I prepare for this test? How you prepare will depend on which tests are being done and the reason for doing them. You may need to:  Avoid eating for 4-6 hours before the test, or as told by your health care provider.  Stop taking certain medicines before your blood test, as told by your health care provider. Tell a health care provider about:  All medicines you are taking, including vitamins, herbs, eye drops, creams, and over-the-counter medicines.  Any medical conditions you have.  Whether you are pregnant or may be pregnant. How are the results reported? Your test results will be reported as values. Your health care provider will compare your results to normal ranges that were established after testing a large group of people (reference ranges). Reference ranges may vary among labs and hospitals. For the substances measured in liver function tests, common reference ranges are: ALT  Infant: 10-40 international units/L.  Child or adult: 4-36 international units/L at 37C or 4-36 units/L (SI units).  Reference ranges may be higher for older adults. AST  Newborn 89-72 days old: 35-140 units/L.  Child younger than 31 years old: 15-60 units/L.  58-40 years old: 15-50 units/L.  25-37 years old: 10-50 units/L.  73-44 years old: 10-40 units/L.  Adult: 0-35 units/L or 0-0.58 microkatals/L (SI units).  Reference ranges may be higher for older adults. ALP  Child younger than 9 years old: 85-235 units/L.  2-8 years  old: 65-210 units/L.  54-63 years old: 60-300 units/L.  76-37 years old: 30-200 units/L.  Adult: 30-120 units/L or 0.5-2.0 microkatals/L (SI units).  Reference ranges may be higher for  older adults. Total bilirubin  Newborn: 1.0-12.0 mg/dL or 17.1-205 micromoles/L (SI units).  Child or adult: 0.3-1.0 mg/dL or 5.1-17 micromoles/L. Albumin  Premature infant: 3.0-4.2 g/dL.  Newborn: 3.5-5.4 g/dL.  Infant: 4.4-5.4 g/dL.  Child: 4.0-5.9 g/dL.  Adult: 3.5-5.0 g/dL or 35-50 g/L (SI units). PT  11.0-12.5 seconds; 85%-100%. INR  0.8-1.1. Total protein  Premature infant: 4.2-7.6 g/dL.  Newborn: 4.6-7.4 g/dL.  Infant: 6.0-6.7 g/dL.  Child: 6.2-8.0 g/dL.  Adult: 6.4-8.3 g/dL or 64-83 g/L (SI units). What do the results mean? Results that are within the reference ranges are considered normal. For each substance measured, results outside the reference range can indicate various health issues. ALT  Levels above the normal range may indicate liver disease. AST  Levels above the normal range may indicate liver disease. Sometimes levels also increase after burns, surgery, heart attack, muscle damage, or seizure. ALP  Levels above the normal range may be seen in biliary obstruction, liver diseases, bone disease, thyroid disease, tumors, fractures, leukemia, lymphoma, or several other conditions. People with blood type O or B may show higher levels after a fatty meal.  Levels below the normal range may indicate bone and teeth conditions, malnutrition, protein deficiency, or Wilson's disease. Total bilirubin  Levels above the normal range may indicate problems with the liver, gallbladder, or bile ducts. Albumin  Levels above the normal range may indicate dehydration. They may also be caused by a diet that is high in protein.  Levels below the normal range may indicate kidney disease, liver disease, or malabsorption of nutrients. PT and INR  Levels above the normal range  mean that your blood is clotting slower than normal. This may be due to blood disorders, liver disorders, or low levels of vitamin K. Total protein  Levels above the normal range may be due to infection or other diseases.  Levels below the normal range may be due to an immune system disorder, bleeding, burns, kidney disorder, liver disease, trouble absorbing or getting nutrients, or other conditions that affect the intestines. Talk with your health care provider about what your results mean. Questions to ask your health care provider Ask your health care provider, or the department that is doing the test:  When will my results be ready?  How will I get my results?  What are my treatment options?  What other tests do I need?  What are my next steps? Summary  Liver function tests are done to see how well your liver is working.  These tests measure various proteins and enzymes in your blood. The results can alert your health care provider to inflammation, damage, or disease in your liver.  Talk with your health care provider about what your results mean. This information is not intended to replace advice given to you by your health care provider. Make sure you discuss any questions you have with your health care provider. Document Revised: 10/07/2017 Document Reviewed: 12/02/2016 Elsevier Patient Education  [place].

## 2019-09-09 ENCOUNTER — Encounter: Payer: Self-pay | Admitting: Family Medicine

## 2019-09-13 NOTE — Telephone Encounter (Signed)
This has been taking care of. Please see result notes ° °

## 2019-09-15 ENCOUNTER — Telehealth: Payer: Self-pay | Admitting: Family Medicine

## 2019-09-15 NOTE — Telephone Encounter (Signed)
Cheryl Stein stated that the pt is not qualified for the lung screening due to her age. The age limit is 74-77.

## 2019-09-16 ENCOUNTER — Encounter: Payer: Self-pay | Admitting: Family Medicine

## 2019-09-20 ENCOUNTER — Telehealth: Payer: Self-pay | Admitting: Acute Care

## 2019-09-20 NOTE — Telephone Encounter (Signed)
If this is supposed to be for a consult appt, it needs to be an MD not Eric Form for the provider for the pt to see. I am not sure what needs to be done to reopen the cancelled request to have pt scheduled for an appt.  Patrice, please advise.

## 2019-09-20 NOTE — Telephone Encounter (Signed)
FYI Spoke with Lenna Sciara desk associate with [facility], reviewed Dr Volanda Napoleon recommendations/ advised regarding pt referral, state that she sent  the message to the Triage Nurse to open the referral back up for pt to have a follow up CT, advised to call out office with any questions

## 2019-09-20 NOTE — Telephone Encounter (Signed)
This is not a low dose screening CT for a smoker.  Pt was noted to have a new incidental lung nodule noted on CT abdomen pelvis on 06/2019.  31-month follow-up recommended.  This would be the follow-up CT.

## 2019-09-20 NOTE — Telephone Encounter (Signed)
cont'd  Noted in April when a CT was done of abdomen and pelvis. Dr. Volanda Napoleon is requesting referral be re-opened and that pt is scheduled for 6 month followup CT.

## 2019-09-26 NOTE — Telephone Encounter (Signed)
Re-opened referral - called and left message on pt vm to call back to schedule pulmonary consult - pr

## 2019-10-06 NOTE — Telephone Encounter (Signed)
Patient scheduled with Dr. Lamonte Sakai on 10/26/2019 - pr

## 2019-10-26 ENCOUNTER — Encounter: Payer: Self-pay | Admitting: Emergency Medicine

## 2019-10-26 ENCOUNTER — Ambulatory Visit (INDEPENDENT_AMBULATORY_CARE_PROVIDER_SITE_OTHER): Payer: 59 | Admitting: Emergency Medicine

## 2019-10-26 ENCOUNTER — Other Ambulatory Visit: Payer: Self-pay

## 2019-10-26 DIAGNOSIS — R911 Solitary pulmonary nodule: Secondary | ICD-10-CM | POA: Insufficient documentation

## 2019-10-26 NOTE — Progress Notes (Signed)
Subjective:    Patient ID: Cheryl Stein, female    DOB: 30-Sep-1980, 39 y.o.   MRN: [id number]  HPI 39 year old never smoker with a history of endometriosis, hypercoagulable state, allergic rhinitis, IBS. She had COVID in April 2021, recovered and did well. She is active, does not have any dyspnea.   She referred today for an asymptomatic 3 mm right lower lobe pulmonary nodule that was seen on a CT of her abdomen and pelvis done on 06/12/2019.  Was felt to be new compared with previous CT abdomen 04/05/2018. These CT's were done to eval her endometriosis, IBS   Review of Systems As per HPI  Past Medical History:  Diagnosis Date  . Allergy   . AMA (advanced maternal age) multigravida 48+   . Anemia    periodic  . Anxiety   . Clotting disorder ([place])    to prevent miscarage  . Endometriosis   . Family history of adverse reaction to anesthesia    mother has PONV  . GERD (gastroesophageal reflux disease)   . History of multiple miscarriages   . Hx of varicella   . Low blood pressure reading   . 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     Family History  Problem Relation Age of Onset  . Hypertension Mother   . Parkinson's disease Father   . Birth defects Brother        anacephaly  . Diabetes Maternal Grandfather   . Diabetes Paternal Grandmother   . Colon cancer Neg Hx   . Esophageal cancer Neg Hx   . Rectal cancer Neg Hx   . Stomach cancer Neg Hx     Maternal GF with lung CA - never smoker  Social History   Socioeconomic History  . Marital status: Married    Spouse name: Not on file  . Number of children: Not on file  . Years of education: Not on file  . Highest education level: Not on file  Occupational History  . Not on file  Tobacco Use  . Smoking status: Never Smoker  . Smokeless tobacco: Never Used  Vaping Use  . Vaping Use: Never used  Substance and Sexual Activity  . Alcohol use: Yes    Comment: occ  . Drug  use: No  . Sexual activity: Yes    Comment: pt said she knows she is not pregnant  Other Topics Concern  . Not on file  Social History Narrative  . Not on file   Social Determinants of Health   Financial Resource Strain:   . Difficulty of Paying Living Expenses: Not on file  Food Insecurity:   . Worried About Charity fundraiser in the Last Year: Not on file  . Ran Out of Food in the Last Year: Not on file  Transportation Needs:   . Lack of Transportation (Medical): Not on file  . Lack of Transportation (Non-Medical): Not on file  Physical Activity:   . Days of Exercise per Week: Not on file  . Minutes of Exercise per Session: Not on file  Stress:   . Feeling of Stress : Not on file  Social Connections:   . Frequency of Communication with Friends and Family: Not on file  . Frequency of Social Gatherings with Friends and Family: Not on file  . Attends Religious Services: Not on file  . Active Member of Clubs or Organizations: Not on file  . Attends Club or  Organization Meetings: Not on file  . Marital Status: Not on file  Intimate Partner Violence:   . Fear of Current or Ex-Partner: Not on file  . Emotionally Abused: Not on file  . Physically Abused: Not on file  . Sexually Abused: Not on file    From [place], has always lived in [place] Has worked in [employer]  No inhaled exposure. She does like to hike.   Allergies  Allergen Reactions  . Codeine Nausea And Vomiting  . Latex Rash     Outpatient Medications Prior to Visit  Medication Sig Dispense Refill  . heparin 5000 UNIT/ML injection Inject 5,000 Units into the skin in the morning and at bedtime. 7 days out of the month after ovulation     . ondansetron (ZOFRAN) 4 MG tablet Take 1 tablet (4 mg total) by mouth every 8 (eight) hours as needed for nausea or vomiting. NEEDS OFFICE VISIT FOR FURTHER REFILLS 30 tablet 0  . cetirizine (ZYRTEC) 10 MG tablet Take 10 mg by mouth daily.     No facility-administered  medications prior to visit.         Objective:   Physical Exam Vitals:   10/26/19 1004  BP: 108/60  Pulse: 80  Temp: (!) 97.3 F (36.3 C)  SpO2: 100%  Weight: 149 lb (67.6 kg)  Height: 5\' 7"  (1.702 m)    Gen: Pleasant, well-nourished, in no distress,  normal affect  ENT: No lesions,  mouth clear,  oropharynx clear, no postnasal drip  Neck: No JVD, no stridor  Lungs: No use of accessory muscles, no crackles or wheezing on normal respiration, no wheeze on forced expiration  Cardiovascular: RRR, heart sounds normal, no murmur or gallops, no peripheral edema  Musculoskeletal: No deformities, no cyanosis or clubbing  Neuro: alert, awake, non focal  Skin: Warm, no lesions or rash       Assessment & Plan:  Pulmonary nodule 3 mm pulmonary nodule in the right lower lobe seen on her CT scan of the abdomen.  Based on her low risk status she could make an argument to defer any further scans, but I think it is reasonable to perform a CT chest to ensure no interval change and also to allow [place] to evaluate for any larger nodules that might need serial follow-up.  We will perform at the end of September which would be the 22-month mark.  If the nodule stable and there are no larger nodules present elsewhere then I think we can defer any further work-up.  Baltazar Apo, MD, PhD 10/26/2019, 10:37 AM [facility] [phone] or if no answer [phone]

## 2019-10-26 NOTE — Patient Instructions (Signed)
We will plan to perform a CT scan of her chest without contrast at the end of September to follow your pulmonary nodule for interval stability. Follow-up with Dr. Lamonte Sakai after the CT scan so that we can review the results together.

## 2019-10-26 NOTE — Addendum Note (Signed)
Addended byDocia Barrier on: 10/26/2019 10:49 AM   Modules accepted: Orders

## 2019-10-26 NOTE — Assessment & Plan Note (Signed)
3 mm pulmonary nodule in the right lower lobe seen on her CT scan of the abdomen.  Based on her low risk status she could make an argument to defer any further scans, but I think it is reasonable to perform a CT chest to ensure no interval change and also to allow [place] to evaluate for any larger nodules that might need serial follow-up.  We will perform at the end of September which would be the 23-month mark.  If the nodule stable and there are no larger nodules present elsewhere then I think we can defer any further work-up.

## 2019-12-01 ENCOUNTER — Other Ambulatory Visit: Payer: Self-pay

## 2019-12-01 ENCOUNTER — Ambulatory Visit (INDEPENDENT_AMBULATORY_CARE_PROVIDER_SITE_OTHER)
Admission: RE | Admit: 2019-12-01 | Discharge: 2019-12-01 | Disposition: A | Discharge status: Home / Self Care | Payer: 59 | Source: Ambulatory Visit | Attending: Emergency Medicine | Admitting: Emergency Medicine

## 2019-12-01 DIAGNOSIS — R911 Solitary pulmonary nodule: Secondary | ICD-10-CM

## 2020-01-03 ENCOUNTER — Other Ambulatory Visit: Payer: Self-pay | Admitting: Obstetrics and Gynecology

## 2020-01-03 DIAGNOSIS — R928 Other abnormal and inconclusive findings on diagnostic imaging of breast: Secondary | ICD-10-CM

## 2020-01-14 ENCOUNTER — Other Ambulatory Visit: Payer: Self-pay | Admitting: Obstetrics and Gynecology

## 2020-01-14 ENCOUNTER — Other Ambulatory Visit: Payer: Self-pay

## 2020-01-14 ENCOUNTER — Ambulatory Visit
Admission: RE | Admit: 2020-01-14 | Discharge: 2020-01-14 | Disposition: A | Discharge status: Home / Self Care | Payer: 59 | Source: Ambulatory Visit | Attending: Obstetrics and Gynecology | Admitting: Obstetrics and Gynecology

## 2020-01-14 DIAGNOSIS — R928 Other abnormal and inconclusive findings on diagnostic imaging of breast: Secondary | ICD-10-CM

## 2020-01-14 DIAGNOSIS — R921 Mammographic calcification found on diagnostic imaging of breast: Secondary | ICD-10-CM

## 2020-05-09 ENCOUNTER — Encounter: Payer: Self-pay | Admitting: Family Medicine

## 2020-05-09 ENCOUNTER — Other Ambulatory Visit: Payer: Self-pay

## 2020-05-09 ENCOUNTER — Ambulatory Visit (INDEPENDENT_AMBULATORY_CARE_PROVIDER_SITE_OTHER): Payer: 59 | Admitting: Family Medicine

## 2020-05-09 VITALS — BP 122/80 | HR 107 | Temp 98.4°F | Ht 67.0 in | Wt 151.0 lb

## 2020-05-09 DIAGNOSIS — R590 Localized enlarged lymph nodes: Secondary | ICD-10-CM

## 2020-05-09 DIAGNOSIS — K581 Irritable bowel syndrome with constipation: Secondary | ICD-10-CM | POA: Diagnosis not present

## 2020-05-09 MED ORDER — DICYCLOMINE HCL 10 MG PO CAPS: 10.0000 mg | ORAL_CAPSULE | Freq: Three times a day (TID) | ORAL | 0 refills | Status: DC

## 2020-05-09 NOTE — Patient Instructions (Signed)
Lymphadenopathy  Lymphadenopathy means that your lymph glands are swollen or larger than normal. Lymph glands, also called lymph nodes, are collections of tissue that filter excess fluid, bacteria, viruses, and waste from your bloodstream. They are part of your body's disease-fighting system (immune system), which protects your body from germs. There may be different causes of lymphadenopathy, depending on where it is in your body. Some types go away on their own. Lymphadenopathy can occur anywhere that you have lymph glands, including these areas:  Neck (cervical lymphadenopathy).  Chest (mediastinal lymphadenopathy).  Lungs (hilar lymphadenopathy).  Underarms (axillary lymphadenopathy).  Groin (inguinal lymphadenopathy). When your immune system responds to germs, infection-fighting cells and fluid build up in your lymph glands. This causes some swelling and enlargement. If the lymph nodes do not go back to normal size after you have an infection or disease, your health care provider may do tests. These tests help to monitor your condition and find the reason why the glands are still swollen and enlarged. Follow these instructions at home:  Get plenty of rest.  Your health care provider may recommend over-the-counter medicines for pain. Take over-the-counter and prescription medicines only as told by your health care provider.  If directed, apply heat to swollen lymph glands as often as told by your health care provider. Use the heat source that your health care provider recommends, such as a moist heat pack or a heating pad. ? Place a towel between your skin and the heat source. ? Leave the heat on for 20-30 minutes. ? Remove the heat if your skin turns bright red. This is especially important if you are unable to feel pain, heat, or cold. You may have a greater risk of getting burned.  Check your affected lymph glands every day for changes. Check other lymph gland areas as told by your  health care provider. Check for changes such as: ? More swelling. ? Sudden increase in size. ? Redness or pain. ? Hardness.  Keep all follow-up visits. This is important.   Contact a health care provider if you have:  Lymph glands that: ? Are still swollen after 2 weeks. ? Have suddenly gotten bigger or the swelling spreads. ? Are red, painful, or hard.  Fluid leaking from the skin near an enlarged lymph gland.  Problems with breathing.  A fever, chills, or night sweats.  Fatigue.  A sore throat.  Pain in your abdomen.  Weight loss. Get help right away if you have:  Severe pain.  Chest pain.  Shortness of breath. These symptoms may represent a serious problem that is an emergency. Do not wait to see if the symptoms will go away. Get medical help right away. Call your local emergency services (911 in the [place] Do not drive yourself to the hospital. Summary  Lymphadenopathy means that your lymph glands are swollen or larger than normal.  Lymph glands, also called lymph nodes, are collections of tissue that filter excess fluid, bacteria, viruses, and waste from the bloodstream. They are part of your body's disease-fighting system (immune system).  Lymphadenopathy can occur anywhere that you have lymph glands.  If the lymph nodes do not go back to normal size after you have an infection or disease, your health care provider may do tests to monitor your condition and find the reason why the glands are still swollen and enlarged.  Check your affected lymph glands every day for changes. Check other lymph gland areas as told by your health care provider. This information   is not intended to replace advice given to you by your health care provider. Make sure you discuss any questions you have with your health care provider. Document Revised: 12/14/2019 Document Reviewed: 12/14/2019 Elsevier Patient Education  [place].  Diet for Irritable Bowel Syndrome When you  have irritable bowel syndrome (IBS), it is very important to eat the foods and follow the eating habits that are best for your condition. IBS may cause various symptoms such as pain in the abdomen, constipation, or diarrhea. Choosing the right foods can help to ease the discomfort from these symptoms. Work with your health care provider and diet and nutrition specialist (dietitian) to find the eating plan that will help to control your symptoms. What are tips for following this plan?  Keep a food diary. This will help you identify foods that cause symptoms. Write down: ? What you eat and when you eat it. ? What symptoms you have. ? When symptoms occur in relation to your meals, such as "pain in abdomen 2 hours after dinner."  Eat your meals slowly and in a relaxed setting.  Aim to eat 5-6 small meals per day. Do not skip meals.  Drink enough fluid to keep your urine pale yellow.  Ask your health care provider if you should take an over-the-counter probiotic to help restore healthy bacteria in your gut (digestive tract). ? Probiotics are foods that contain good bacteria and yeasts.  Your dietitian may have specific dietary recommendations for you based on your symptoms. He or she may recommend that you: ? Avoid foods that cause symptoms. Talk with your dietitian about other ways to get the same nutrients that are in those problem foods. ? Avoid foods with gluten. Gluten is a protein that is found in rye, wheat, and barley. ? Eat more foods that contain soluble fiber. Examples of foods with high soluble fiber include oats, seeds, and certain fruits and vegetables. Take a fiber supplement if directed by your dietitian. ? Reduce or avoid certain foods called FODMAPs. These are foods that contain carbohydrates that are hard to digest. Ask your doctor which foods contain these carbohydrates.      What foods are not recommended? The following are some foods and drinks that may make your symptoms  worse:  Fatty foods, such as french fries.  Foods that contain gluten, such as pasta and cereal.  Dairy products, such as milk, cheese, and ice cream.  Chocolate.  Alcohol.  Products with caffeine, such as coffee.  Carbonated drinks, such as soda.  Foods that are high in FODMAPs. These include certain fruits and vegetables.  Products with sweeteners such as honey, high fructose corn syrup, sorbitol, and mannitol. The items listed above may not be a complete list of foods and beverages you should avoid. Contact a dietitian for more information.   What foods are good sources of fiber? Your health care provider or dietitian may recommend that you eat more foods that contain fiber. Fiber can help to reduce constipation and other IBS symptoms. Add foods with fiber to your diet a little at a time so your body can get used to them. Too much fiber at one time might cause gas and swelling of your abdomen. The following are some foods that are good sources of fiber:  Berries, such as raspberries, strawberries, and blueberries.  Tomatoes.  Carrots.  Brown rice.  Oats.  Seeds, such as chia and pumpkin seeds. The items listed above may not be a complete list of  recommended sources of fiber. Contact your dietitian for more options. Where to find more information  International Foundation for Functional Gastrointestinal Disorders: www.iffgd.[employer] of Diabetes and Digestive and Kidney Diseases: [employer] Summary  When you have irritable bowel syndrome (IBS), it is very important to eat the foods and follow the eating habits that are best for your condition.  IBS may cause various symptoms such as pain in the abdomen, constipation, or diarrhea.  Choosing the right foods can help to ease the discomfort that comes from symptoms.  Keep a food diary. This will help you identify foods that cause symptoms.  Your health care provider or diet and nutrition specialist  (dietitian) may recommend that you eat more foods that contain fiber. This information is not intended to replace advice given to you by your health care provider. Make sure you discuss any questions you have with your health care provider. Document Revised: 10/20/2019 Document Reviewed: 10/20/2019 Elsevier Patient Education  2021 [place] stands for fermentable oligosaccharides, disaccharides, monosaccharides, and polyols. These are sugars that are hard for some people to digest. A low-FODMAP eating plan may help some people who have irritable bowel syndrome (IBS) and certain other bowel (intestinal) diseases to manage their symptoms. This meal plan can be complicated to follow. Work with a diet and nutrition specialist (dietitian) to make a low-FODMAP eating plan that is right for you. A dietitian can help make sure that you get enough nutrition from this diet. What are tips for following this plan? Reading food labels  Check labels for hidden FODMAPs such as: ? High-fructose syrup. ? Honey. ? Agave. ? Natural fruit flavors. ? Onion or garlic powder.  Choose low-FODMAP foods that contain 3-4 grams of fiber per serving.  Check food labels for serving sizes. Eat only one serving at a time to make sure FODMAP levels stay low. Shopping  Shop with a list of foods that are recommended on this diet and make a meal plan. Meal planning  Follow a low-FODMAP eating plan for up to 6 weeks, or as told by your health care provider or dietitian.  To follow the eating plan: 1. Eliminate high-FODMAP foods from your diet completely. Choose only low-FODMAP foods to eat. You will do this for 2-6 weeks. 2. Gradually reintroduce high-FODMAP foods into your diet one at a time. Most people should wait a few days before introducing the next new high-FODMAP food into their meal plan. Your dietitian can recommend how quickly you may reintroduce foods. 3. Keep a daily  record of what and how much you eat and drink. Make note of any symptoms that you have after eating. 4. Review your daily record with a dietitian regularly to identify which foods you can eat and which foods you should avoid. General tips  Drink enough fluid each day to keep your urine pale yellow.  Avoid processed foods. These often have added sugar and may be high in FODMAPs.  Avoid most dairy products, whole grains, and sweeteners.  Work with a dietitian to make sure you get enough fiber in your diet.  Avoid high FODMAP foods at meals to manage symptoms. Recommended foods Fruits Bananas, oranges, tangerines, lemons, limes, blueberries, raspberries, strawberries, grapes, cantaloupe, honeydew melon, kiwi, papaya, passion fruit, and pineapple. Limited amounts of dried cranberries, banana chips, and shredded coconut. Vegetables Eggplant, zucchini, cucumber, peppers, green beans, bean sprouts, lettuce, arugula, kale, Swiss chard, spinach, collard greens, bok choy, summer squash, potato, and  tomato. Limited amounts of corn, carrot, and sweet potato. Green parts of scallions. Grains Gluten-free grains, such as rice, oats, buckwheat, quinoa, corn, polenta, and millet. Gluten-free pasta, bread, or cereal. Rice noodles. Corn tortillas. Meats and other proteins Unseasoned beef, pork, poultry, or fish. Eggs. [place] Salines. Tofu (firm) and tempeh. Limited amounts of nuts and seeds, such as almonds, walnuts, [place] nuts, pecans, peanuts, nut butters, pumpkin seeds, chia seeds, and sunflower seeds. Dairy Lactose-free milk, yogurt, and kefir. Lactose-free cottage cheese and ice cream. Non-dairy milks, such as almond, coconut, hemp, and rice milk. Non-dairy yogurt. Limited amounts of goat cheese, brie, mozzarella, parmesan, swiss, and other hard cheeses. Fats and oils Butter-free spreads. Vegetable oils, such as olive, canola, and sunflower oil. Seasoning and other foods Artificial sweeteners with names that do  not end in "ol," such as aspartame, saccharine, and stevia. Maple syrup, white table sugar, raw sugar, brown sugar, and molasses. Mayonnaise, soy sauce, and tamari. Fresh basil, coriander, parsley, rosemary, and thyme. Beverages Water and mineral water. Sugar-sweetened soft drinks. Small amounts of orange juice or cranberry juice. Black and green tea. Most dry wines. Coffee. The items listed above may not be a complete list of foods and beverages you can eat. Contact a dietitian for more information. Foods to avoid Fruits Fresh, dried, and juiced forms of apple, pear, watermelon, peach, plum, cherries, apricots, blackberries, boysenberries, figs, nectarines, and mango. Avocado. Vegetables Chicory root, artichoke, asparagus, cabbage, snow peas, Brussels sprouts, broccoli, sugar snap peas, mushrooms, celery, and cauliflower. Onions, garlic, leeks, and the white part of scallions. Grains Wheat, including kamut, durum, and semolina. Barley and bulgur. Couscous. Wheat-based cereals. Wheat noodles, bread, crackers, and pastries. Meats and other proteins Fried or fatty meat. Sausage. Cashews and pistachios. Soybeans, baked beans, black beans, chickpeas, kidney beans, fava beans, navy beans, lentils, black-eyed peas, and split peas. Dairy Milk, yogurt, ice cream, and soft cheese. Cream and sour cream. Milk-based sauces. Custard. Buttermilk. Soy milk. Seasoning and other foods Any sugar-free gum or candy. Foods that contain artificial sweeteners such as sorbitol, mannitol, isomalt, or xylitol. Foods that contain honey, high-fructose corn syrup, or agave. Bouillon, vegetable stock, beef stock, and chicken stock. Garlic and onion powder. Condiments made with onion, such as hummus, chutney, pickles, relish, salad dressing, and salsa. Tomato paste. Beverages Chicory-based drinks. Coffee substitutes. Chamomile tea. Fennel tea. Sweet or fortified wines such as port or sherry. Diet soft drinks made with isomalt,  mannitol, maltitol, sorbitol, or xylitol. Apple, pear, and mango juice. Juices with high-fructose corn syrup. The items listed above may not be a complete list of foods and beverages you should avoid. Contact a dietitian for more information. Summary  FODMAP stands for fermentable oligosaccharides, disaccharides, monosaccharides, and polyols. These are sugars that are hard for some people to digest.  A low-FODMAP eating plan is a short-term diet that helps to ease symptoms of certain bowel diseases.  The eating plan usually lasts up to 6 weeks. After that, high-FODMAP foods are reintroduced gradually and one at a time. This can help you find out which foods may be causing symptoms.  A low-FODMAP eating plan can be complicated. It is best to work with a dietitian who has experience with this type of plan. This information is not intended to replace advice given to you by your health care provider. Make sure you discuss any questions you have with your health care provider. Document Revised: 07/07/2019 Document Reviewed: 07/07/2019 Elsevier Patient Education  [place].

## 2020-05-09 NOTE — Progress Notes (Signed)
Subjective:    Patient ID: Cheryl Stein, female    DOB: [birth year], 40 y.o.   MRN: [id number]  Chief Complaint  Patient presents with  . Pain in Right Axillary Area    Irritated from shaving a few weeks ago. Feels a pulling sensation.     HPI Patient was seen today for ongoing concerns.  Pt notes h/o R axillary lymphadenopathy her whole life.  May occur after shaving.  Recently noticed a cord like structure that is only appreciated when she had her R arm propped up in the window while driving.  Pt denies pain, but notes a pulling sensation when reaching over head.  Concerned it may be lymph web syndrome after looking online, though she has never had any lymph nodes removed.  Pt had mammogram in October/November small calcifications noted.  Changes in skin or breast, nipple discharge or drainage, masses.  Patient also notes increase in IBS flares.  Having cramping, abdominal pain sensation of incomplete emptying of stool.  Interested in starting Bentyl as previously discussed by GI.  Followed by Dr. Hilarie Fredrickson.  Past Medical History:  Diagnosis Date  . Allergy   . AMA (advanced maternal age) multigravida 58+   . Anemia    periodic  . Anxiety   . Clotting disorder ([place])    to prevent miscarage  . Endometriosis   . Family history of adverse reaction to anesthesia    mother has PONV  . GERD (gastroesophageal reflux disease)   . History of multiple miscarriages   . Hx of varicella   . Low blood pressure reading   . 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 Codeine Nausea And Vomiting  . Latex Rash    ROS General: Denies fever, chills, night sweats, changes in weight, changes in appetite HEENT: Denies headaches, ear pain, changes in vision, rhinorrhea, sore throat CV: Denies CP, palpitations, SOB, orthopnea Pulm: Denies SOB, cough, wheezing GI: Denies abdominal pain, nausea, vomiting, diarrhea,  constipation GU: Denies dysuria, hematuria, frequency, vaginal discharge Msk: Denies muscle cramps, joint pains Neuro: Denies weakness, numbness, tingling Skin: Denies rashes, bruising  + lymphadenopathy, cord like structure in RUE/axilla Psych: Denies depression, anxiety, hallucinations     Objective:    Blood pressure 122/80, pulse (!) 107, temperature 98.4 F (36.9 C), height 5\' 7"  (1.702 m), weight 151 lb (68.5 kg), SpO2 99 %, not currently breastfeeding.  Gen. Pleasant, well-nourished, in no distress, normal affect   HEENT: [place]/AT, face symmetric, conjunctiva clear, no scleral icterus, PERRLA, EOMI, nares patent without drainage Lungs: no accessory muscle use, CTAB, no wheezes or rales Cardiovascular: RRR, no m/r/g, no peripheral edema Musculoskeletal: R axilla with 6 mm lymph node without tenderness.  No masses or other abnormalities appreciated.  No deformities, no cyanosis or clubbing, normal tone Neuro:  A&Ox3, CN II-XII intact, normal gait Skin:  Warm, no lesions/ rash.     Wt Readings from Last 3 Encounters:  05/09/20 151 lb (68.5 kg)  10/26/19 149 lb (67.6 kg)  09/08/19 148 lb 4 oz (67.2 kg)    Lab Results  Component Value Date   WBC 7.5 09/08/2019   HGB 13.2 09/08/2019   HCT 39.8 09/08/2019   PLT 249.0 09/08/2019   GLUCOSE 86 09/08/2019   CHOL 133 09/08/2019   TRIG 73.0 09/08/2019   HDL 57.00 09/08/2019   LDLCALC 61 09/08/2019   ALT 11 09/08/2019   AST 14  09/08/2019   NA 136 09/08/2019   K 4.2 09/08/2019   CL 103 09/08/2019   CREATININE 0.82 09/08/2019   BUN 13 09/08/2019   CO2 26 09/08/2019   TSH 1.58 09/08/2019   HGBA1C 5.2 09/08/2019    Assessment/Plan:  Irritable bowel syndrome with constipation  -Discussed restarting low FODMAP diet -Given handout -Start Bentyl 10 mg 3 times daily -For continued or worsening symptoms follow-up with GI - Plan: dicyclomine (BENTYL) 10 MG capsule  Axillary lymphadenopathy -Cordlike structure pt appreciated  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

## 2020-06-04 ENCOUNTER — Ambulatory Visit: Payer: 59 | Admitting: Physician Assistant

## 2020-06-05 ENCOUNTER — Ambulatory Visit: Payer: 59 | Admitting: Physician Assistant

## 2020-06-05 ENCOUNTER — Encounter: Payer: Self-pay | Admitting: Physician Assistant

## 2020-06-05 VITALS — BP 94/68 | HR 88 | Ht 66.0 in | Wt 150.0 lb

## 2020-06-05 DIAGNOSIS — R112 Nausea with vomiting, unspecified: Secondary | ICD-10-CM | POA: Diagnosis not present

## 2020-06-05 DIAGNOSIS — K582 Mixed irritable bowel syndrome: Secondary | ICD-10-CM

## 2020-06-05 MED ORDER — DICYCLOMINE HCL 10 MG PO CAPS: 10.0000 mg | ORAL_CAPSULE | Freq: Three times a day (TID) | ORAL | 2 refills | Status: DC

## 2020-06-05 MED ORDER — ONDANSETRON HCL 4 MG PO TABS: 4.0000 mg | ORAL_TABLET | Freq: Three times a day (TID) | ORAL | 3 refills | Status: DC | PRN

## 2020-06-05 NOTE — Patient Instructions (Signed)
If you are age 41 or older, your body mass index should be between 23-30. Your Body mass index is 24.21 kg/m. If this is out of the aforementioned range listed, please consider follow up with your Primary Care Provider.  If you are age 58 or younger, your body mass index should be between 19-25. Your Body mass index is 24.21 kg/m. If this is out of the aformentioned range listed, please consider follow up with your Primary Care Provider.   We have sent the following medications to your pharmacy for you to pick up at your convenience: Dicyclomine 10 mg , Zofran 4 mg  Thank you for choosing me and [facility].  Ellouise Newer, PA-C

## 2020-06-05 NOTE — Progress Notes (Signed)
Chief Complaint: IBS flare  HPI:    Cheryl Stein is a 40 year old female with a past medical history as listed below including IBS known to Dr. Hilarie Fredrickson, who presents to clinic today with complaint of a "IBS flare".    12/31/2017 patient seen in clinic by Tye Savoy for progression of chronic left upper quadrant burning radiating through to her back with no real leg relief with a PPI.  Was recommended she have an EGD.  Also described worsening of chronic intermittent diarrhea.    01/12/2018 EGD with LA grade a esophagitis and otherwise normal.    06/12/2019 CT abdomen pelvis with post cholecystectomy, small right lower lobe pulmonary nodule.  Otherwise normal.    05/09/2020 patient seen by PCP and described increase in IBS flares with cramping abdominal pain and incomplete emptying of stool.  At that time discussed low FODMAP diet and started Bentyl 10 mg 3 times daily.    Today, the patient presents to clinic and explains that after being seen in our clinic in 2019 all of her symptoms seemed to come down and she would just have flares that would last for 2 to 7 days of abdominal pain and other "functional issues", she tells me that this "was not perfect", but it was something she could deal with as it just seemed to come and go here and there.  Then she woke up on 05/07/2020 with what she thought was a flare with abdominal cramping and alteration in bowel habits between constipation and diarrhea and just being somewhat uncomfortable all day.  Tells me that then 1 night she was having so much discomfort that she had trouble sleeping.  When she saw her PCP she asked for Dicyclomine and was taking this 3 times a day and over the past week everything seems to be about 90% better.  Tells me that she was not even going to come to the appointment but her husband was worried that something else was going on given that this seemed to last longer than her typical issues.  Patient is typically chronically on a  probiotic but had stopped this recently because she was not sure if it was causing more of her problems after reading about possible bacterial overgrowth.  Also tells me that she has been looking at the low FODMAP diet and has identified some triggers so is trying to avoid these.  Was also using Zofran as needed for some nausea when the pain was terrible.  Currently feeling much better.  Denies emotional triggers or increases in stress/anxiety.    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    Denies fever, chills, weight loss, blood in her stool, family history of colon cancer or IBD.  Past Medical History:  Diagnosis Date  . Allergy   . AMA (advanced maternal age) multigravida 33+   . Anemia    periodic  . Anxiety   . Clotting disorder ([place])    to prevent miscarage  . Endometriosis   . Family history of adverse reaction to anesthesia    mother has PONV  . GERD (gastroesophageal reflux disease)   . History of multiple miscarriages   . Hx of varicella   . Low blood pressure reading   . 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    Past Surgical History:  Procedure Laterality Date  . CESAREAN SECTION  12/21/2010   Procedure: CESAREAN SECTION;  Surgeon: Luz Lex,  MD;  Location: [facility];  Service: Gynecology;  Laterality: N/A;  Primary Cesarean Section with birth of baby boy @ 51  . CHOLECYSTECTOMY N/A 08/18/2016   Procedure: LAPAROSCOPIC CHOLECYSTECTOMY WITH INTRAOPERATIVE CHOLANGIOGRAM;  Surgeon: Alphonsa Overall, MD;  Location: [facility];  Service: General;  Laterality: N/A;  . DILATION AND EVACUATION    . DILATION AND EVACUATION N/A 02/15/2016   Procedure: DILATATION AND EVACUATION (D&E) 2ND TRIMESTER WITH GENETIC STUDIES;  Surgeon: Louretta Shorten, MD;  Location: [facility];  Service: Gynecology;  Laterality: N/A;  ok per Chasity for this date and time  . HYMENECTOMY    . LAPAROSCOPY    . WISDOM TOOTH EXTRACTION      Current Outpatient  Medications  Medication Sig Dispense Refill  . dicyclomine (BENTYL) 10 MG capsule Take 1 capsule (10 mg total) by mouth 3 (three) times daily before meals. 90 capsule 0  . heparin 5000 UNIT/ML injection Inject 5,000 Units into the skin in the morning and at bedtime. 7 days out of the month after ovulation    . ondansetron (ZOFRAN) 4 MG tablet Take 1 tablet (4 mg total) by mouth every 8 (eight) hours as needed for nausea or vomiting. NEEDS OFFICE VISIT FOR FURTHER REFILLS 30 tablet 0   No current facility-administered medications for this visit.    Allergies as of 06/05/2020 - Review Complete 05/09/2020  Allergen Reaction Noted  . Codeine Nausea And Vomiting 12/21/2010  . Latex Rash 12/21/2010    Family History  Problem Relation Age of Onset  . Hypertension Mother   . Parkinson's disease Father   . Birth defects Brother        anacephaly  . Diabetes Maternal Grandfather   . Diabetes Paternal Grandmother   . Colon cancer Neg Hx   . Esophageal cancer Neg Hx   . Rectal cancer Neg Hx   . Stomach cancer Neg Hx     Social History   Socioeconomic History  . Marital status: Married    Spouse name: Not on file  . Number of children: Not on file  . Years of education: Not on file  . Highest education level: Not on file  Occupational History  . Not on file  Tobacco Use  . Smoking status: Never Smoker  . Smokeless tobacco: Never Used  Vaping Use  . Vaping Use: Never used  Substance and Sexual Activity  . Alcohol use: Yes    Comment: occ  . Drug use: No  . Sexual activity: Yes    Comment: pt said she knows she is not pregnant  Other Topics Concern  . Not on file  Social History Narrative  . Not on file   Social Determinants of Health   Financial Resource Strain: Not on file  Food Insecurity: Not on file  Transportation Needs: Not on file  Physical Activity: Not on file  Stress: Not on file  Social Connections: Not on file  Intimate Partner Violence: Not on file     Review of Systems:    Constitutional: No weight loss, fever or chills Cardiovascular: No chest pain Respiratory: No SOB Gastrointestinal: See HPI and otherwise negative   Physical Exam:  Vital signs: BP 94/68   Pulse 88   Ht 5\' 6"  (1.676 m)   Wt 150 lb (68 kg)   LMP 05/09/2020 (Approximate)   BMI 24.21 kg/m   Constitutional:   Pleasant Caucasian female appears to be in NAD, Well developed, Well nourished, alert and cooperative Respiratory: Respirations even and  unlabored. Lungs clear to auscultation bilaterally.   No wheezes, crackles, or rhonchi.  Cardiovascular: Normal S1, S2. No MRG. Regular rate and rhythm. No peripheral edema, cyanosis or pallor.  Gastrointestinal:  Soft, nondistended, nontender. No rebound or guarding. Normal bowel sounds. No appreciable masses or hepatomegaly. Rectal:  Not performed.  Psychiatric: Demonstrates good judgement and reason without abnormal affect or behaviors.  No recent labs or imaging.  Assessment: 1.  IBS: Flares of abdominal pain with cramping and alteration in bowel habits, better with Dicyclomine; most likely IBS  Plan: 1.  Discussed with patient that most likely her symptoms represent IBS, but this is a diagnosis of exclusion so the only way to rule everything else out would be to do a colonoscopy.  Patient tells me she is feeling better now and declines this procedure.  Explained that she can call us back if flares become more frequent or she has a change in her symptoms and the Dicyclomine is not helping.  At that point recommend a colonoscopy. 2.  Refilled Dicyclomine 10 mg 3 times daily, #90 with 3 refills. 3.  Refilled Zofran 4 mg every 4-6 hours as needed for nausea #15 with 3 refills. 4.  Reviewed the low FODMAP diet. 5.  Patient to follow in clinic as needed with [place] in the near future.  Ellouise Newer, PA-C [facility] 06/05/2020, 10:14 AM  Cc: Billie Ruddy, MD

## 2020-06-07 ENCOUNTER — Telehealth: Payer: Self-pay

## 2020-06-07 NOTE — Telephone Encounter (Signed)
Spoke with patient in regards to SIBO breath test. Patient is aware that I will fax the order and a copy of her insurance card to [facility] and they will contact her for the initial patient payment of approximately $100. Patient is okay with payment amount. Patient is aware that she will need to come by the office at her convenience to pick up SIBO breath test kit Monday - Friday, before 4:30 PM. Advised patient that instructions on how to collect the sample are in the box and she will need to contact [facility] if she has any questions on how to collect. Patient has been scheduled for a follow up on Wednesday, 07/11/20 at 3:30 PM. Patient states that she has plenty of Bentyl for now but will let us know if she needs refill at all. Advised patient to give [place] a call or send a My Chart message if she has any questions or concerns prior to her appt. Patient verbalized understanding of all information and had no concerns at the end of the call.   SIBO breath test orders faxed to [facility] at [phone].  SIBO breath test placed at front desk.

## 2020-06-07 NOTE — Telephone Encounter (Signed)
-----   Message from Levin Erp, [place] sent at 06/07/2020 10:40 AM EDT ----- Regarding: call please Hello,    Sad to hear that you had such a rough night.  As we discussed before I would go ahead and schedule out the Bentyl taking it 3-4 times a day, best if 20 to 30 minutes before meals and at bedtime.    1.  We can definitely go ahead and test you for SIBO.  I will have my nurse start working on that.  My nurse can call and discuss it further with you.   2. I do not think that fecal occult blood testing is necessary.  This can detect blood from all sorts of sources including hemorrhoids etc. it really does not tell us a lot.  As we discussed before if symptoms continue or are not helped by the Bentyl then I would recommend a colonoscopy. 3. Amitriptyline is definitely a possibility.  We still use it in these conditions and especially for irritable bowel.  For now I would say if you are having good luck with the Bentyl then maybe try just scheduling this out as above and if you continue with symptoms at that point then we can discuss changing therapies or adding Amitriptyline.  I will have my nurse call you to discuss SIBO testing and let us go ahead and set up a follow-up office visit in 4 to 6 weeks to see how you are doing.  Obviously feel free to contact us before then if you are having problems or have further questions.  __________________________________________________________________________________   Above is what I just sent to the patient.  Can you contact her in regards to setting up SIBO testing.  Also please ensure she has enough Bentyl to start taking this scheduled.  She also needs a follow-up with me as above.  Thanks, J LL

## 2020-06-19 NOTE — Progress Notes (Signed)
Addendum: Reviewed and agree with assessment and management plan. Nkosi Cortright M, MD  

## 2020-07-11 ENCOUNTER — Ambulatory Visit: Payer: 59 | Admitting: Physician Assistant

## 2020-07-18 ENCOUNTER — Ambulatory Visit
Admission: RE | Admit: 2020-07-18 | Discharge: 2020-07-18 | Disposition: A | Discharge status: Home / Self Care | Payer: 59 | Source: Ambulatory Visit | Attending: Obstetrics and Gynecology | Admitting: Obstetrics and Gynecology

## 2020-07-18 ENCOUNTER — Other Ambulatory Visit: Payer: Self-pay | Admitting: Obstetrics and Gynecology

## 2020-07-18 ENCOUNTER — Other Ambulatory Visit: Payer: Self-pay

## 2020-07-18 DIAGNOSIS — R921 Mammographic calcification found on diagnostic imaging of breast: Secondary | ICD-10-CM

## 2020-07-18 DIAGNOSIS — R2231 Localized swelling, mass and lump, right upper limb: Secondary | ICD-10-CM

## 2020-08-09 ENCOUNTER — Encounter: Payer: Self-pay | Admitting: Physician Assistant

## 2020-08-09 ENCOUNTER — Ambulatory Visit (INDEPENDENT_AMBULATORY_CARE_PROVIDER_SITE_OTHER): Payer: 59 | Admitting: Physician Assistant

## 2020-08-09 VITALS — BP 80/60 | HR 96 | Ht 66.5 in | Wt 150.5 lb

## 2020-08-09 DIAGNOSIS — R1031 Right lower quadrant pain: Secondary | ICD-10-CM

## 2020-08-09 DIAGNOSIS — K580 Irritable bowel syndrome with diarrhea: Secondary | ICD-10-CM

## 2020-08-09 MED ORDER — NA SULFATE-K SULFATE-MG SULF 17.5-3.13-1.6 GM/177ML PO SOLN: 1.0000 | Freq: Once | ORAL | 0 refills | Status: AC

## 2020-08-09 NOTE — Progress Notes (Signed)
Addendum: Reviewed and agree with assessment and management plan. Sakari Raisanen M, MD  

## 2020-08-09 NOTE — Progress Notes (Signed)
Chief Complaint: Follow-up IBS with constipation and diarrhea  HPI:     Cheryl Stein is a 40 year old female with a past medical history as listed below, known to Dr. Hilarie Fredrickson, with a history of IBS, who returns to clinic today for follow-up of IBS.      12/31/2017 patient seen in clinic by Tye Savoy for progression of chronic left upper quadrant burning radiating through to her back with no real leg relief with a PPI.  Was recommended she have an EGD.  Also described worsening of chronic intermittent diarrhea.    01/12/2018 EGD with LA grade a esophagitis and otherwise normal.    06/12/2019 CT abdomen pelvis with post cholecystectomy, small right lower lobe pulmonary nodule.  Otherwise normal.    05/09/2020 patient seen by PCP and described increase in IBS flares with cramping abdominal pain and incomplete emptying of stool.  At that time discussed low FODMAP diet and started Bentyl 10 mg 3 times daily.    06/05/2020 patient seen in clinic and describes that she is to have flares that would last for 2 to 7 days of abdominal pain and "functional issues" and this is something she could deal with but recently she had become uncomfortable every day.  At that time had started dicyclomine 3 times daily and everything was 90% better.  That time discussed that IBS was typically a diagnosis of exclusion and the only way to completely rule everything else would be a colonoscopy.  She declined procedure at that time.  Refilled dicyclomine 10 mg 3 times daily and Zofran 4 mg every 4-6 hours as needed for nausea.  Reviewed the low FODMAP diet.    06/07/2020 patient contacted our office and described having a very rough night.  At that time we discussed testing her for SIBO.  She asked about testing for blood in her stool at that point recommended a colonoscopy instead.  Also she asked about amitriptyline.    Today, the patient tells me that she has just continued to have pretty constant IBS symptoms noting that  she may have 1 good day and then 2 bad days where before symptoms used to come and go for months at a time.  She tried scheduling her Bentyl 10 mg 3 times daily and this really did not help so she is now using it as needed but does not feel like it works as well as prior.  Also tells me she feels like she has been able to pinpoint where most of the pain is coming from on bad days is in her right lower quadrant.  She has read this may be her "ileocecal valve".  Continues with erratic stools noting she can be anywhere from diarrhea to constipation or anywhere in between.  Did send off her at home breath test.  We do not have results yet.    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  Youngest just finished kindergarten.    Denies fever, chills, weight loss or blood in her stool.  Past Medical History:  Diagnosis Date   Allergy    AMA (advanced maternal age) multigravida 35+    Anemia    periodic   Anxiety    Clotting disorder ([place])    to prevent miscarage   Endometriosis    Family history of adverse reaction to anesthesia    mother has PONV  GERD (gastroesophageal reflux disease)    History of multiple miscarriages    Hx of varicella    Low blood pressure reading    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    Past Surgical History:  Procedure Laterality Date   CESAREAN SECTION  12/21/2010   Procedure: CESAREAN SECTION;  Surgeon: Luz Lex, MD;  Location: [facility];  Service: Gynecology;  Laterality: N/A;  Primary Cesarean Section with birth of baby boy @ 108   CHOLECYSTECTOMY N/A 08/18/2016   Procedure: LAPAROSCOPIC CHOLECYSTECTOMY WITH INTRAOPERATIVE CHOLANGIOGRAM;  Surgeon: Alphonsa Overall, MD;  Location: [facility];  Service: General;  Laterality: N/A;   DILATION AND EVACUATION     DILATION AND EVACUATION N/A 02/15/2016   Procedure: DILATATION AND EVACUATION (D&E) 2ND TRIMESTER  WITH GENETIC STUDIES;  Surgeon: Louretta Shorten, MD;  Location: [facility];  Service: Gynecology;  Laterality: N/A;  ok per Chasity for this date and time   HYMENECTOMY     LAPAROSCOPY     WISDOM TOOTH EXTRACTION      Current Outpatient Medications  Medication Sig Dispense Refill   dicyclomine (BENTYL) 10 MG capsule Take 1 capsule (10 mg total) by mouth 3 (three) times daily before meals. 90 capsule 2   naproxen (NAPROSYN) 500 MG tablet as needed.     ondansetron (ZOFRAN) 4 MG tablet Take 1 tablet (4 mg total) by mouth every 8 (eight) hours as needed for nausea or vomiting. NEEDS OFFICE VISIT FOR FURTHER REFILLS 15 tablet 3   heparin 5000 UNIT/ML injection Inject 5,000 Units into the skin in the morning and at bedtime. 7 days out of the month after ovulation (Patient not taking: Reported on 08/09/2020)     No current facility-administered medications for this visit.    Allergies as of 08/09/2020 - Review Complete 08/09/2020  Allergen Reaction Noted   Codeine Nausea And Vomiting 12/21/2010   Latex Rash 12/21/2010    Family History  Problem Relation Age of Onset   Hypertension Mother    Parkinson's disease Father    Birth defects Brother        anacephaly   Diabetes Maternal Grandfather    Diabetes Paternal Grandmother    Colon cancer Neg Hx    Esophageal cancer Neg Hx    Rectal cancer Neg Hx    Stomach cancer Neg Hx     Social History   Socioeconomic History   Marital status: Married    Spouse name: Not on file   Number of children: Not on file   Years of education: Not on file   Highest education level: Not on file  Occupational History   Not on file  Tobacco Use   Smoking status: Never   Smokeless tobacco: Never  Vaping Use   Vaping Use: Never used  Substance and Sexual Activity   Alcohol use: Yes    Comment: occ   Drug use: No   Sexual activity: Yes    Comment: pt said she knows she is not pregnant  Other Topics Concern   Not on file  Social History Narrative   Not  on file   Social Determinants of Health   Financial Resource Strain: Not on file  Food Insecurity: Not on file  Transportation Needs: Not on file  Physical Activity: Not on file  Stress: Not on file  Social Connections: Not on file  Intimate Partner Violence: Not on file    Review of Systems:  Constitutional: No weight loss, fever or chills Cardiovascular: No chest pain Respiratory: No SOB Gastrointestinal: See HPI and otherwise negative   Physical Exam:  Vital signs: BP (!) 80/60 (BP Location: Left Arm, Patient Position: Sitting, Cuff Size: Normal)   Pulse 96   Ht 5' 6.5" (1.689 m) Comment: height measured without shoes  Wt 150 lb 8 oz (68.3 kg)   LMP 07/21/2020   BMI 23.93 kg/m    Constitutional:   Pleasant Caucasian female appears to be in NAD, Well developed, Well nourished, alert and cooperative Respiratory: Respirations even and unlabored. Lungs clear to auscultation bilaterally.   No wheezes, crackles, or rhonchi.  Cardiovascular: Normal S1, S2. No MRG. Regular rate and rhythm. No peripheral edema, cyanosis or pallor.  Gastrointestinal:  Soft, nondistended, nontender. No rebound or guarding. Normal bowel sounds. No appreciable masses or hepatomegaly. Rectal:  Not performed.  Psychiatric: Demonstrates good judgement and reason without abnormal affect or behaviors.  No recent labs.  Assessment: 1.  IBS-mixed: Continues with erratic stools and abdominal cramping, Bentyl 10 mg 3 times daily did not really help 2.  Right lower quadrant pain: With above  Plan: 1.  At this point recommend colonoscopy to ensure there is nothing underlying going on.  This was scheduled with Dr. Fuller Plan as he had sooner availability than Dr. Hilarie Fredrickson.  Patient will continue to follow with Dr. Hilarie Fredrickson after time of procedures as her primary GI physician.  Did provide the patient a detailed list of risks for the procedure and she agrees to proceed. 2.  For now we will not change patient's  medication but did discuss that she can use up to 20 mg of Bentyl 4 times daily if needed. 3.  Also asking my nursing staff to track down patient's SIBO results. 4.  Patient to follow in clinic per recommendations after time of procedure.  Cheryl Newer, PA-C [facility] 08/09/2020, 10:54 AM  Cc: Billie Ruddy, MD

## 2020-08-09 NOTE — Progress Notes (Signed)
Reviewed and agree with management plan.  Latora Quarry T. Burnett Lieber, MD FACG [phone]  

## 2020-08-09 NOTE — Patient Instructions (Signed)
You have been scheduled for a colonoscopy. Please follow written instructions given to you at your visit today.  Please pick up your prep supplies at the pharmacy within the next 1-3 days. If you use inhalers (even only as needed), please bring them with you on the day of your procedure.   If you are age 41 or older, your body mass index should be between 23-30. Your Body mass index is 23.93 kg/m. If this is out of the aforementioned range listed, please consider follow up with your Primary Care Provider.  If you are age 32 or younger, your body mass index should be between 19-25. Your Body mass index is 23.93 kg/m. If this is out of the aformentioned range listed, please consider follow up with your Primary Care Provider.   __________________________________________________________  The [facility] providers would like to encourage you to use [facility] to communicate with providers for non-urgent requests or questions.  Due to long hold times on the telephone, sending your provider a message by [facility] may be a faster and more efficient way to get a response.  Please allow 48 business hours for a response.  Please remember that this is for non-urgent requests.

## 2020-08-20 NOTE — Telephone Encounter (Signed)
Spoke with Cheryl Stein at [employer], she states that the results were faxed over on 08/14/20. She has re-faxed the results today. Will await fax.

## 2020-08-21 ENCOUNTER — Telehealth: Payer: Self-pay

## 2020-08-21 ENCOUNTER — Other Ambulatory Visit: Payer: Self-pay | Admitting: Physician Assistant

## 2020-08-21 NOTE — Telephone Encounter (Signed)
Patient notified via mychart

## 2020-08-21 NOTE — Progress Notes (Signed)
Results from SIBO testing are negative. These will be scanned into patient's chart.  Nurse will call.  Ellouise Newer, PA-C

## 2020-08-21 NOTE — Telephone Encounter (Signed)
-----   Message from Levin Erp, [place] sent at 08/21/2020 12:57 PM EDT ----- Regarding: sibo results Patient results are negative. Please let her know. Thanks-JLL

## 2020-08-22 ENCOUNTER — Encounter: Payer: Self-pay | Admitting: Gastroenterology

## 2020-08-22 ENCOUNTER — Ambulatory Visit (AMBULATORY_SURGERY_CENTER): Payer: 59 | Admitting: Gastroenterology

## 2020-08-22 ENCOUNTER — Other Ambulatory Visit: Payer: Self-pay

## 2020-08-22 VITALS — BP 95/64 | HR 87 | Temp 98.4°F | Resp 19 | Ht 66.0 in | Wt 150.0 lb

## 2020-08-22 DIAGNOSIS — R197 Diarrhea, unspecified: Secondary | ICD-10-CM

## 2020-08-22 DIAGNOSIS — R1031 Right lower quadrant pain: Secondary | ICD-10-CM

## 2020-08-22 DIAGNOSIS — K580 Irritable bowel syndrome with diarrhea: Secondary | ICD-10-CM

## 2020-08-22 MED ORDER — SODIUM CHLORIDE 0.9 % IV SOLN: 500.0000 mL | INTRAVENOUS | Status: DC

## 2020-08-22 MED ORDER — AMITRIPTYLINE HCL 25 MG PO TABS: 25.0000 mg | ORAL_TABLET | Freq: Every day | ORAL | 1 refills | Status: DC

## 2020-08-22 NOTE — Op Note (Signed)
[place] Patient Name: Cheryl Stein Procedure Date: 08/22/2020 10:47 AM MRN: [id number] Endoscopist: Ladene Artist , MD Age: 40 Referring MD:  Date of Birth: [birth year] Gender: Female Account #: [id number] Procedure:                Colonoscopy Indications:              Abdominal pain in the right lower quadrant,                            Clinically significant diarrhea of unexplained                            origin Medicines:                Monitored Anesthesia Care Procedure:                Pre-Anesthesia Assessment:                           - Prior to the procedure, a History and Physical                            was performed, and patient medications and                            allergies were reviewed. The patient's tolerance of                            previous anesthesia was also reviewed. The risks                            and benefits of the procedure and the sedation                            options and risks were discussed with the patient.                            All questions were answered, and informed consent                            was obtained. Prior Anticoagulants: The patient has                            taken no previous anticoagulant or antiplatelet                            agents. ASA Grade Assessment: I - A normal, healthy                            patient. After reviewing the risks and benefits,                            the patient was deemed in satisfactory condition to  undergo the procedure.                           After obtaining informed consent, the colonoscope                            was passed under direct vision. Throughout the                            procedure, the patient's blood pressure, pulse, and                            oxygen saturations were monitored continuously. The                            Olympus CF-HQ190 [id number]) Colonoscope was                             introduced through the anus and advanced to the the                            terminal ileum, with identification of the                            appendiceal orifice and IC valve. The terminal                            ileum, ileocecal valve, appendiceal orifice, and                            rectum were photographed. The quality of the bowel                            preparation was excellent. The colonoscopy was                            performed without difficulty. The patient tolerated                            the procedure well. Scope In: 10:58:15 AM Scope Out: 11:10:08 AM Scope Withdrawal Time: 0 hours 9 minutes 18 seconds  Total Procedure Duration: 0 hours 11 minutes 53 seconds  Findings:                 The perianal and digital rectal examinations were                            normal.                           The terminal ileum appeared normal.                           The entire examined colon appeared normal on direct  and retroflexion views. Random biopsies were taken                            with a cold forceps for histology. Complications:            No immediate complications. Estimated blood loss:                            None. Estimated Blood Loss:     Estimated blood loss: none. Impression:               - The examined portion of the ileum was normal.                           - The entire examined colon is normal on direct and                            retroflexion views. Biopsied. Recommendation:           - Repeat colonoscopy in 10 years for screening                            purposes with Dr. Hilarie Fredrickson.                           - Patient has a contact number available for                            emergencies. The signs and symptoms of potential                            delayed complications were discussed with the                            patient. Return to normal activities tomorrow.                             Written discharge instructions were provided to the                            patient.                           - Resume previous diet.                           - Continue present medications.                           - Await pathology results.                           - Trail of a low FODMAP diet.                           - Consider trial of Robinul or Amitriptyline.                           -  GI office appt with Dr. Hilarie Fredrickson in 6-8 weeks. Ladene Artist, MD 08/22/2020 11:17:53 AM This report has been signed electronically.

## 2020-08-22 NOTE — Progress Notes (Signed)
Need to schedule a GI office visit with Dr. Hilarie Fredrickson in 6-8 weeks.

## 2020-08-22 NOTE — Patient Instructions (Signed)
YOU HAD AN ENDOSCOPIC PROCEDURE TODAY AT THE [facility]:   Refer to the procedure report that was given to you for any specific questions about what was found during the examination.  If the procedure report does not answer your questions, please call your gastroenterologist to clarify.  If you requested that your care partner not be given the details of your procedure findings, then the procedure report has been included in a sealed envelope for you to review at your convenience later.  YOU SHOULD EXPECT: Some feelings of bloating in the abdomen. Passage of more gas than usual.  Walking can help get rid of the air that was put into your GI tract during the procedure and reduce the bloating. If you had a lower endoscopy (such as a colonoscopy or flexible sigmoidoscopy) you may notice spotting of blood in your stool or on the toilet paper. If you underwent a bowel prep for your procedure, you may not have a normal bowel movement for a few days.  Please Note:  You might notice some irritation and congestion in your nose or some drainage.  This is from the oxygen used during your procedure.  There is no need for concern and it should clear up in a day or so.  SYMPTOMS TO REPORT IMMEDIATELY:   Following lower endoscopy (colonoscopy or flexible sigmoidoscopy):  Excessive amounts of blood in the stool  Significant tenderness or worsening of abdominal pains  Swelling of the abdomen that is new, acute  Fever of 100F or higher   Following upper endoscopy (EGD)  Vomiting of blood or coffee ground material  New chest pain or pain under the shoulder blades  Painful or persistently difficult swallowing  New shortness of breath  Fever of 100F or higher  Black, tarry-looking stools  For urgent or emergent issues, a gastroenterologist can be reached at any hour by calling [phone]. Do not use MyChart messaging for urgent concerns.    DIET:  We do recommend a small meal at first, but  then you may proceed to your regular diet.  Drink plenty of fluids but you should avoid alcoholic beverages for 24 hours.  ACTIVITY:  You should plan to take it easy for the rest of today and you should NOT DRIVE or use heavy machinery until tomorrow (because of the sedation medicines used during the test).    FOLLOW UP: Our staff will call the number listed on your records 48-72 hours following your procedure to check on you and address any questions or concerns that you may have regarding the information given to you following your procedure. If we do not reach you, we will leave a message.  We will attempt to reach you two times.  During this call, we will ask if you have developed any symptoms of COVID 19. If you develop any symptoms (ie: fever, flu-like symptoms, shortness of breath, cough etc.) before then, please call [phone].  If you test positive for Covid 19 in the 2 weeks post procedure, please call and report this information to us.    If any biopsies were taken you will be contacted by phone or by letter within the next 1-3 weeks.  Please call us at [phone] if you have not heard about the biopsies in 3 weeks.    SIGNATURES/CONFIDENTIALITY: You and/or your care partner have signed paperwork which will be entered into your electronic medical record.  These signatures attest to the fact that that the information above on   your After Visit Summary has been reviewed and is understood.  Full responsibility of the confidentiality of this discharge information lies with you and/or your care-partner. 

## 2020-08-22 NOTE — Progress Notes (Signed)
PT taken to PACU. Monitors in place. VSS. Report given to RN. 

## 2020-08-22 NOTE — Progress Notes (Signed)
Called to room to assist during endoscopic procedure.  Patient ID and intended procedure confirmed with present staff. Received instructions for my participation in the procedure from the performing physician.  

## 2020-08-24 ENCOUNTER — Telehealth: Payer: Self-pay

## 2020-08-24 NOTE — Telephone Encounter (Signed)
  Follow up Call-  Call back number 08/22/2020 01/12/2018  Post procedure Call Back phone  # [phone] cell  Permission to leave phone message Yes Yes  Some recent data might be hidden     Patient questions:  Do you have a fever, pain , or abdominal swelling? No. Pain Score  0 *  Have you tolerated food without any problems? Yes.    Have you been able to return to your normal activities? Yes.    Do you have any questions about your discharge instructions: Diet   No. Medications  No. Follow up visit  No.  Do you have questions or concerns about your Care? No.  Actions: * If pain score is 4 or above: No action needed, pain <4.  Have you developed a fever since your procedure? no  2.   Have you had an respiratory symptoms (SOB or cough) since your procedure? no  3.   Have you tested positive for COVID 19 since your procedure no  4.   Have you had any family members/close contacts diagnosed with the COVID 19 since your procedure?  no   If yes to any of these questions please route to Joylene John, RN and Joella Prince, RN

## 2020-08-24 NOTE — Telephone Encounter (Signed)
  Follow up Call-  Call back number 08/22/2020 01/12/2018  Post procedure Call Back phone  # [phone] cell  Permission to leave phone message Yes Yes  Some recent data might be hidden    1st follow up call made.  NALM

## 2020-09-03 ENCOUNTER — Encounter: Payer: Self-pay | Admitting: Gastroenterology

## 2020-09-17 ENCOUNTER — Other Ambulatory Visit: Payer: Self-pay

## 2020-09-17 ENCOUNTER — Encounter: Payer: Self-pay | Admitting: Family Medicine

## 2020-09-17 ENCOUNTER — Ambulatory Visit (INDEPENDENT_AMBULATORY_CARE_PROVIDER_SITE_OTHER): Payer: 59 | Admitting: Family Medicine

## 2020-09-17 VITALS — BP 110/70 | HR 104 | Temp 98.2°F | Ht 66.0 in | Wt 148.2 lb

## 2020-09-17 DIAGNOSIS — Z13220 Encounter for screening for lipoid disorders: Secondary | ICD-10-CM

## 2020-09-17 DIAGNOSIS — R7989 Other specified abnormal findings of blood chemistry: Secondary | ICD-10-CM

## 2020-09-17 DIAGNOSIS — Z0000 Encounter for general adult medical examination without abnormal findings: Secondary | ICD-10-CM | POA: Diagnosis not present

## 2020-09-17 DIAGNOSIS — Z131 Encounter for screening for diabetes mellitus: Secondary | ICD-10-CM

## 2020-09-17 LAB — COMPREHENSIVE METABOLIC PANEL
ALT: 10 U/L (ref 0–35)
AST: 13 U/L (ref 0–37)
Albumin: 4.4 g/dL (ref 3.5–5.2)
Alkaline Phosphatase: 43 U/L (ref 39–117)
BUN: 13 mg/dL (ref 6–23)
CO2: 24 mEq/L (ref 19–32)
Calcium: 9.1 mg/dL (ref 8.4–10.5)
Chloride: 105 mEq/L (ref 96–112)
Creatinine, Ser: 0.81 mg/dL (ref 0.40–1.20)
GFR: 90.33 mL/min (ref >60.00)
Glucose, Bld: 84 mg/dL (ref 70–99)
Potassium: 4.1 mEq/L (ref 3.5–5.1)
Sodium: 136 mEq/L (ref 135–145)
Total Bilirubin: 0.6 mg/dL (ref 0.2–1.2)
Total Protein: 6.9 g/dL (ref 6.0–8.3)

## 2020-09-17 LAB — LIPID PANEL
Cholesterol: 138 mg/dL (ref 0–200)
HDL: 53.2 mg/dL (ref >39.00)
LDL Cholesterol: 58 mg/dL (ref 0–99)
NonHDL: 85.03
Total CHOL/HDL Ratio: 3
Triglycerides: 136 mg/dL (ref 0.0–149.0)
VLDL: 27.2 mg/dL (ref 0.0–40.0)

## 2020-09-17 LAB — CBC WITH DIFFERENTIAL/PLATELET
Basophils Absolute: 0 10*3/uL (ref 0.0–0.1)
Basophils Relative: 0.7 % (ref 0.0–3.0)
Eosinophils Absolute: 0.1 10*3/uL (ref 0.0–0.7)
Eosinophils Relative: 2.3 % (ref 0.0–5.0)
HCT: 37.8 % (ref 36.0–46.0)
Hemoglobin: 12.6 g/dL (ref 12.0–15.0)
Lymphocytes Relative: 25.6 % (ref 12.0–46.0)
Lymphs Abs: 1.4 10*3/uL (ref 0.7–4.0)
MCHC: 33.3 g/dL (ref 30.0–36.0)
MCV: 80.8 fl (ref 78.0–100.0)
Monocytes Absolute: 0.4 10*3/uL (ref 0.1–1.0)
Monocytes Relative: 6.9 % (ref 3.0–12.0)
Neutro Abs: 3.6 10*3/uL (ref 1.4–7.7)
Neutrophils Relative %: 64.5 % (ref 43.0–77.0)
Platelets: 243 10*3/uL (ref 150.0–400.0)
RBC: 4.68 Mil/uL (ref 3.87–5.11)
RDW: 13.7 % (ref 11.5–15.5)
WBC: 5.6 10*3/uL (ref 4.0–10.5)

## 2020-09-17 LAB — HEMOGLOBIN A1C: Hgb A1c MFr Bld: 5.3 % (ref 4.6–6.5)

## 2020-09-17 NOTE — Progress Notes (Signed)
Epic downtime, see scanned paper note for CPE.  Pt without complaint, daughter's 5th birthday is today.  Exam normal.  Labs ordered included CBC, CMP, Hgb A1C, and lipid panel.  F/u prn. Grier Mitts, MD

## 2020-10-26 ENCOUNTER — Encounter: Payer: Self-pay | Admitting: Physician Assistant

## 2020-10-26 ENCOUNTER — Ambulatory Visit (INDEPENDENT_AMBULATORY_CARE_PROVIDER_SITE_OTHER): Payer: 59 | Admitting: Physician Assistant

## 2020-10-26 VITALS — BP 96/56 | HR 114 | Ht 66.5 in | Wt 150.0 lb

## 2020-10-26 DIAGNOSIS — K580 Irritable bowel syndrome with diarrhea: Secondary | ICD-10-CM

## 2020-10-26 MED ORDER — AMITRIPTYLINE HCL 25 MG PO TABS: 25.0000 mg | ORAL_TABLET | Freq: Every day | ORAL | 3 refills | Status: DC

## 2020-10-26 NOTE — Progress Notes (Signed)
Chief Complaint:  HPI:    Cheryl Stein is a 40 year old female with a past medical history as listed below, known to Dr. Hilarie Fredrickson, who returns to clinic today for follow-up of her IBS.   12/31/2017 patient seen in clinic by Tye Savoy for progression of chronic left upper quadrant burning radiating through to her back with no real leg relief with a PPI.  Was recommended she have an EGD.  Also described worsening of chronic intermittent diarrhea.    01/12/2018 EGD with LA grade a esophagitis and otherwise normal.    06/12/2019 CT abdomen pelvis with post cholecystectomy, small right lower lobe pulmonary nodule.  Otherwise normal.    05/09/2020 patient seen by PCP and described increase in IBS flares with cramping abdominal pain and incomplete emptying of stool.  At that time discussed low FODMAP diet and started Bentyl 10 mg 3 times daily.    06/05/2020 patient seen in clinic and describes that she is to have flares that would last for 2 to 7 days of abdominal pain and "functional issues" and this is something she could deal with but recently she had become uncomfortable every day.  At that time had started dicyclomine 3 times daily and everything was 90% better.  That time discussed that IBS was typically a diagnosis of exclusion and the only way to completely rule everything else would be a colonoscopy.  She declined procedure at that time.  Refilled dicyclomine 10 mg 3 times daily and Zofran 4 mg every 4-6 hours as needed for nausea.  Reviewed the low FODMAP diet.    06/07/2020 patient contacted our office and described having a very rough night.  At that time we discussed testing her for SIBO.  She asked about testing for blood in her stool at that point recommended a colonoscopy instead.  Also she asked about amitriptyline.    08/09/2020 patient described that she continued to have a pretty constant IBS symptoms and thinks she may have 1 good day and then 2 bad days, she tried scheduling her Bentyl  10 mg 3 times a day and this did not really help so she is using it as needed.  Continued with erratic stools anywhere from diarrhea to constipation.  At that time recommended a colonoscopy.  Did discuss that she could increase her Bentyl to 20 mg 4 times a day.  We will try to track down her SIBO results.    08/22/2020 colonoscopy with Dr. Fuller Plan done for abdominal pain in the right lower quadrant and clinically significant diarrhea of unexplained origin.  The entire colon was normal.  Repeat recommended in 10 years for screening with Dr. Hilarie Fredrickson.  Patient told to do a trial of the low FODMAP diet and was started on Amitriptyline 25 mg nightly    Today, the patient tells me that her symptoms are 90% better.  She has a lot more good days and bad days and even when she has a bad day it is not as severe as it was before.  She is feeling well and very happy taking the Amitriptyline 25 mg at night.    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    Denies fever, chills, weight loss or symptoms that awaken her from sleep  Past Medical History:  Diagnosis Date   Allergy    AMA (advanced maternal age) multigravida 35+    Anemia    periodic   Anxiety    Clotting disorder (  [employer])    to prevent miscarage   Endometriosis    Family history of adverse reaction to anesthesia    mother has PONV   GERD (gastroesophageal reflux disease)    History of multiple miscarriages    Hx of varicella    Low blood pressure reading    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    Past Surgical History:  Procedure Laterality Date   CESAREAN SECTION  12/21/2010   Procedure: CESAREAN SECTION;  Surgeon: Luz Lex, MD;  Location: [facility];  Service: Gynecology;  Laterality: N/A;  Primary Cesarean Section with birth of baby boy @ 58   CHOLECYSTECTOMY N/A 08/18/2016   Procedure: LAPAROSCOPIC CHOLECYSTECTOMY WITH INTRAOPERATIVE CHOLANGIOGRAM;  Surgeon: Alphonsa Overall, MD;  Location: [facility];  Service: General;  Laterality: N/A;   DILATION AND EVACUATION     DILATION AND EVACUATION N/A 02/15/2016   Procedure: DILATATION AND EVACUATION (D&E) 2ND TRIMESTER WITH GENETIC STUDIES;  Surgeon: Louretta Shorten, MD;  Location: [facility];  Service: Gynecology;  Laterality: N/A;  ok per [facility] for this date and time   HYMENECTOMY     LAPAROSCOPY     WISDOM TOOTH EXTRACTION      Current Outpatient Medications  Medication Sig Dispense Refill   amitriptyline (ELAVIL) 25 MG tablet Take 1 tablet (25 mg total) by mouth at bedtime. 30 tablet 1   dicyclomine (BENTYL) 10 MG capsule Take 1 capsule (10 mg total) by mouth 3 (three) times daily before meals. 90 capsule 2   heparin 5000 UNIT/ML injection Inject 5,000 Units into the skin in the morning and at bedtime. 7 days out of the month after ovulation (Patient not taking: No sig reported)     naproxen (NAPROSYN) 500 MG tablet as needed.     ondansetron (ZOFRAN) 4 MG tablet Take 1 tablet (4 mg total) by mouth every 8 (eight) hours as needed for nausea or vomiting. NEEDS OFFICE VISIT FOR FURTHER REFILLS 15 tablet 3   No current facility-administered medications for this visit.    Allergies as of 10/26/2020 - Review Complete 09/17/2020  Allergen Reaction Noted   Codeine Nausea And Vomiting 12/21/2010   Latex Rash 12/21/2010    Family History  Problem Relation Age of Onset   Hypertension Mother    Parkinson's disease Father    Birth defects Brother        anacephaly   Diabetes Maternal Grandfather    Diabetes Paternal Grandmother    Colon cancer Neg Hx    Esophageal cancer Neg Hx    Rectal cancer Neg Hx    Stomach cancer Neg Hx     Social History   Socioeconomic History   Marital status: Married    Spouse name: Not on file   Number of children: Not on file   Years of education: Not on file   Highest education level: Not on file  Occupational History   Not on file  Tobacco Use   Smoking status: Never    Smokeless tobacco: Never  Vaping Use   Vaping Use: Never used  Substance and Sexual Activity   Alcohol use: Yes    Comment: occ   Drug use: No   Sexual activity: Yes    Comment: pt said she knows she is not pregnant  Other Topics Concern   Not on file  Social History Narrative   Not on file   Social Determinants of Health   Financial Resource Strain:  Not on file  Food Insecurity: Not on file  Transportation Needs: Not on file  Physical Activity: Not on file  Stress: Not on file  Social Connections: Not on file  Intimate Partner Violence: Not on file    Review of Systems:    Constitutional: No weight loss, fever or chills Cardiovascular: No chest pain Respiratory: No SOB Gastrointestinal: See HPI and otherwise negative   Physical Exam:  Vital signs: BP (!) 96/56   Pulse (!) 114   Ht 5' 6.5" (1.689 m)   Wt 150 lb (68 kg)   BMI 23.85 kg/m   Constitutional:   Pleasant Caucasian female appears to be in NAD, Well developed, Well nourished, alert and cooperative Respiratory: Respirations even and unlabored. Lungs clear to auscultation bilaterally.   No wheezes, crackles, or rhonchi.  Cardiovascular: Normal S1, S2. No MRG. Regular rate and rhythm. No peripheral edema, cyanosis or pallor.  Gastrointestinal:  Soft, nondistended, nontender. No rebound or guarding. Normal bowel sounds. No appreciable masses or hepatomegaly. Psychiatric:  Demonstrates good judgement and reason without abnormal affect or behaviors.  RELEVANT LABS AND IMAGING: CBC    Component Value Date/Time   WBC 5.6 09/17/2020 0831   RBC 4.68 09/17/2020 0831   HGB 12.6 09/17/2020 0831   HCT 37.8 09/17/2020 0831   PLT 243.0 09/17/2020 0831   MCV 80.8 09/17/2020 0831   MCH 27.7 06/12/2019 0555   MCHC 33.3 09/17/2020 0831   RDW 13.7 09/17/2020 0831   LYMPHSABS 1.4 09/17/2020 0831   MONOABS 0.4 09/17/2020 0831   EOSABS 0.1 09/17/2020 0831   BASOSABS 0.0 09/17/2020 0831    CMP     Component Value  Date/Time   NA 136 09/17/2020 0831   K 4.1 09/17/2020 0831   CL 105 09/17/2020 0831   CO2 24 09/17/2020 0831   GLUCOSE 84 09/17/2020 0831   BUN 13 09/17/2020 0831   CREATININE 0.81 09/17/2020 0831   CALCIUM 9.1 09/17/2020 0831   PROT 6.9 09/17/2020 0831   ALBUMIN 4.4 09/17/2020 0831   AST 13 09/17/2020 0831   ALT 10 09/17/2020 0831   ALKPHOS 43 09/17/2020 0831   BILITOT 0.6 09/17/2020 0831   GFRNONAA >60 06/12/2019 0555   GFRAA >60 06/12/2019 0555    Assessment: 1.  IBS: Much improved on Amitriptyline 25 mg nightly, recently normal colonoscopy  Plan: 1.  Refilled patient's Amitriptyline 25 mg nightly #90 with 3 refills. 2.  Patient will call if she has any problems otherwise she was placed in recall for an appointment with Dr. Hilarie Fredrickson in a year.  Ellouise Newer, PA-C [facility] 10/26/2020, 2:31 PM  Cc: Billie Ruddy, MD

## 2020-10-26 NOTE — Patient Instructions (Signed)
We have sent the following medications to your pharmacy for you to pick up at your convenience: Amititriptyline 25 mg nightly.   If you are age 41 or older, your body mass index should be between 23-30. Your Body mass index is 23.85 kg/m. If this is out of the aforementioned range listed, please consider follow up with your Primary Care Provider.  If you are age 13 or younger, your body mass index should be between 19-25. Your Body mass index is 23.85 kg/m. If this is out of the aformentioned range listed, please consider follow up with your Primary Care Provider.   __________________________________________________________  The [facility] providers would like to encourage you to use [facility] to communicate with providers for non-urgent requests or questions.  Due to long hold times on the telephone, sending your provider a message by [facility] may be a faster and more efficient way to get a response.  Please allow 48 business hours for a response.  Please remember that this is for non-urgent requests.

## 2020-10-29 NOTE — Progress Notes (Signed)
Addendum: Reviewed and agree with assessment and management plan. Sukhman Kocher M, MD  

## 2021-01-28 ENCOUNTER — Ambulatory Visit
Admission: RE | Admit: 2021-01-28 | Discharge: 2021-01-28 | Disposition: A | Discharge status: Home / Self Care | Payer: 59 | Source: Ambulatory Visit | Attending: Obstetrics and Gynecology | Admitting: Obstetrics and Gynecology

## 2021-01-28 DIAGNOSIS — R921 Mammographic calcification found on diagnostic imaging of breast: Secondary | ICD-10-CM

## 2021-03-30 IMAGING — US US PELVIS COMPLETE TRANSABD/TRANSVAG W DUPLEX
1 series · 13 of 25 positions shown · non-contrast
Comparison: None.

CLINICAL DATA: Right lower quadrant pelvic pain for 3 days.

EXAM:
TRANSABDOMINAL AND TRANSVAGINAL ULTRASOUND OF PELVIS
DOPPLER ULTRASOUND OF OVARIES
TECHNIQUE: Both transabdominal and transvaginal ultrasound examinations of the
pelvis were performed. Transabdominal technique was performed for
global imaging of the pelvis including uterus, ovaries, adnexal
regions, and pelvic cul-de-sac.
It was necessary to proceed with endovaginal exam following the
transabdominal exam to visualize the endometrium and ovaries. Color
and duplex Doppler ultrasound was utilized to evaluate blood flow to
the ovaries.

[Series 1: us pelvis complete transabd/transvag w duplex · 13 of 84 slices shown]
[im 1/84]
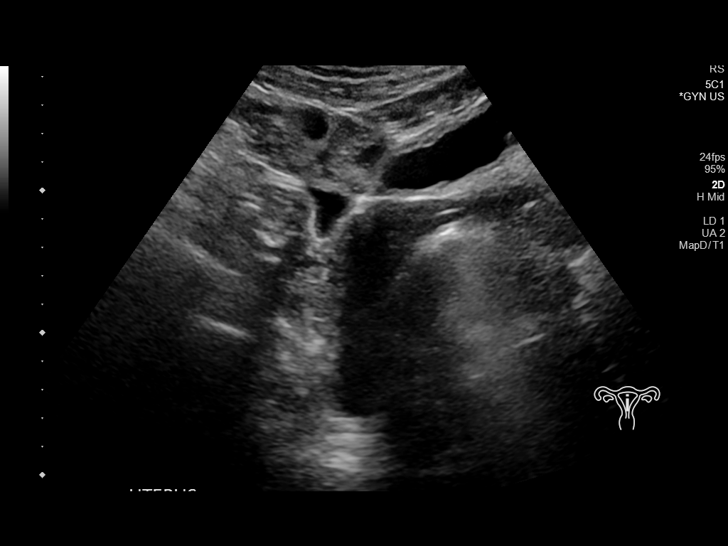
[im 7/84]
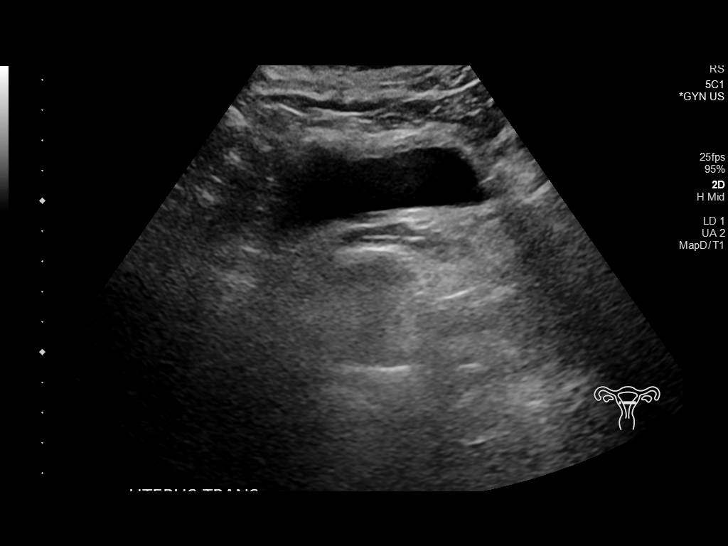
[im 14/84]
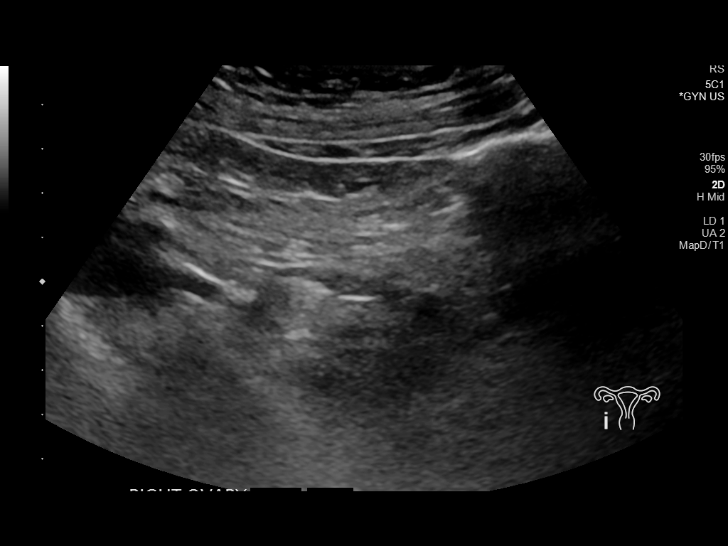
[im 21/84]
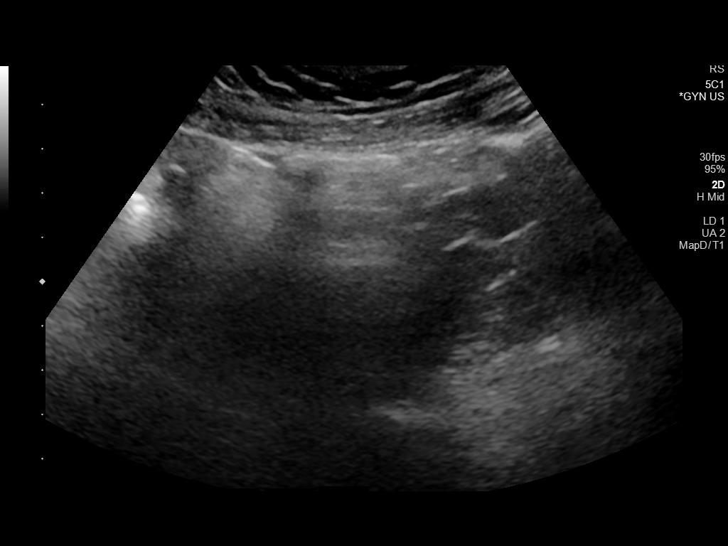
[im 28/84]
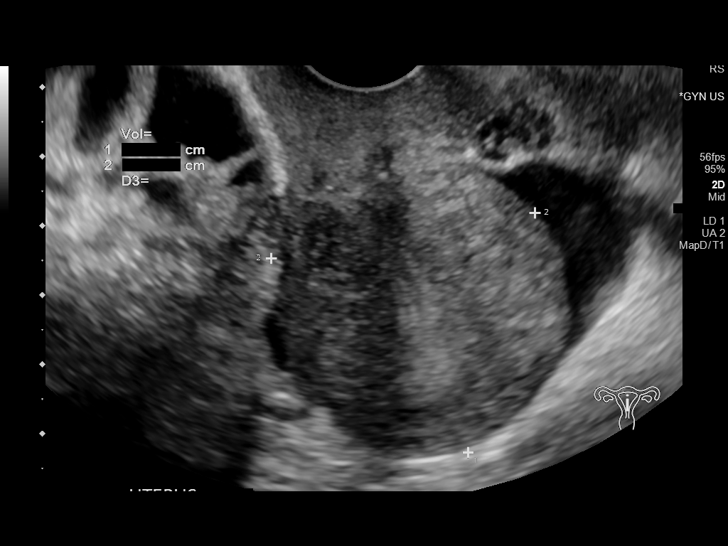
[im 35/84]
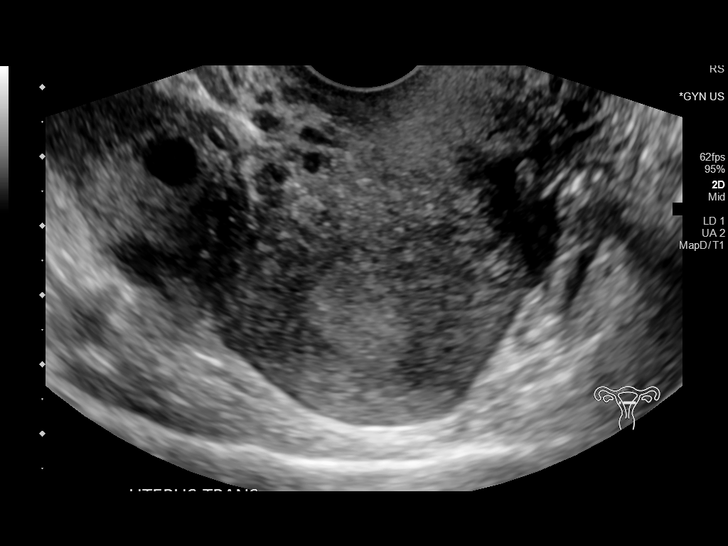
[im 42/84]
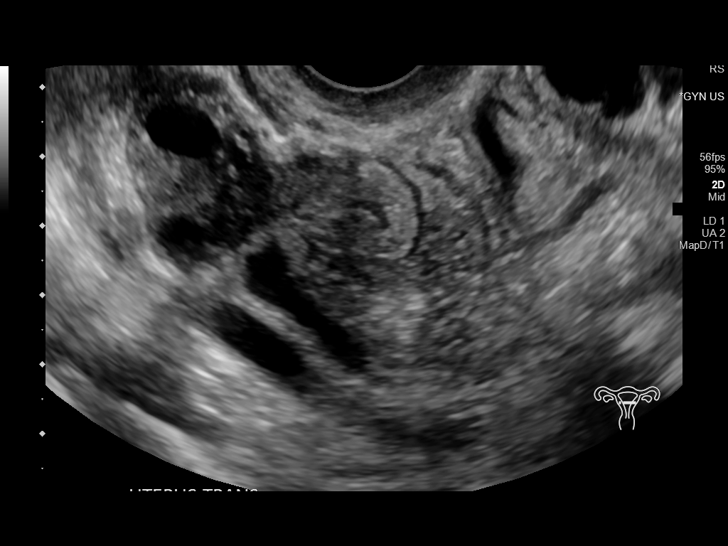
[im 49/84]
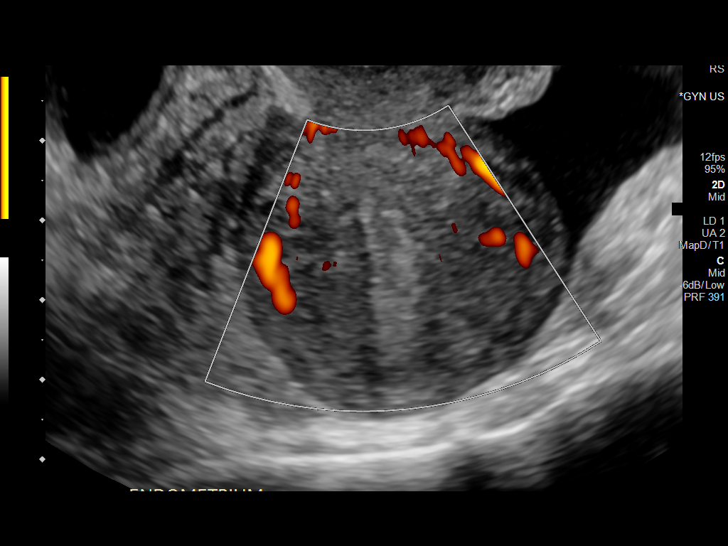
[im 56/84]
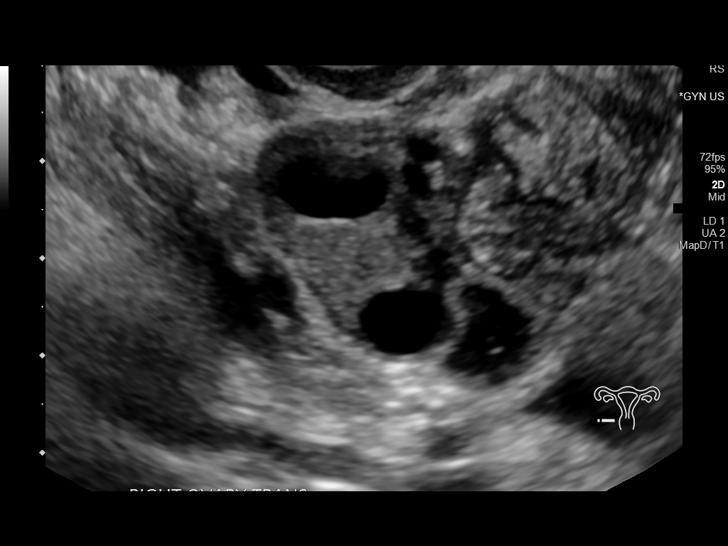
[im 63/84]
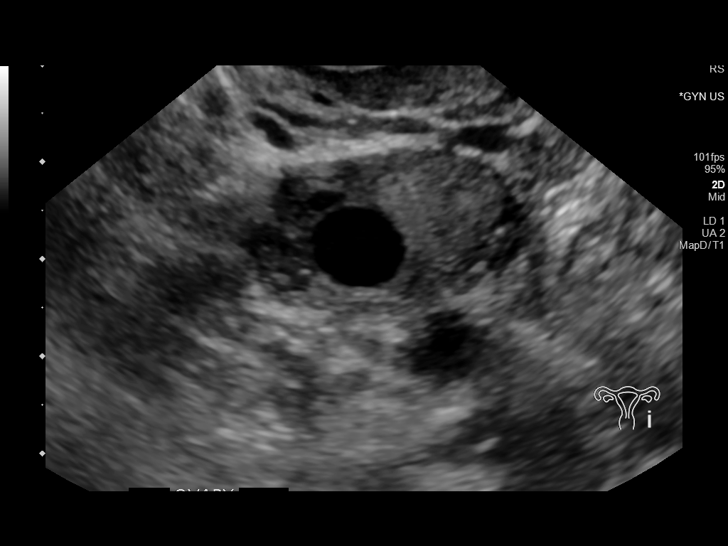
[im 70/84]
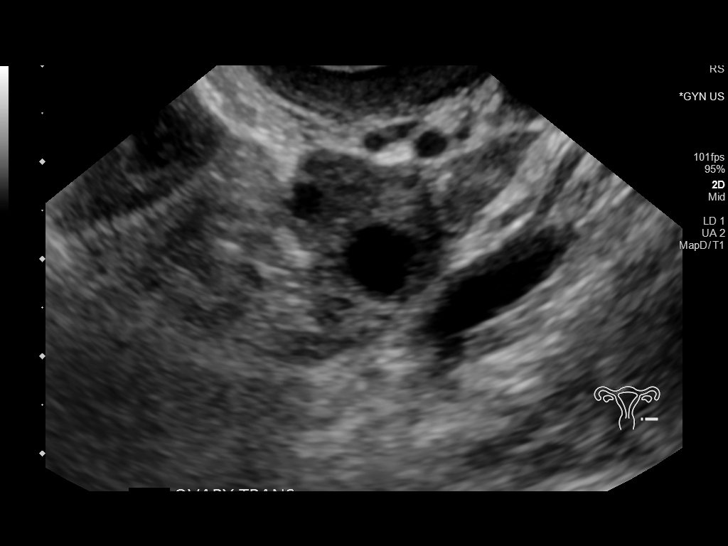
[im 77/84]
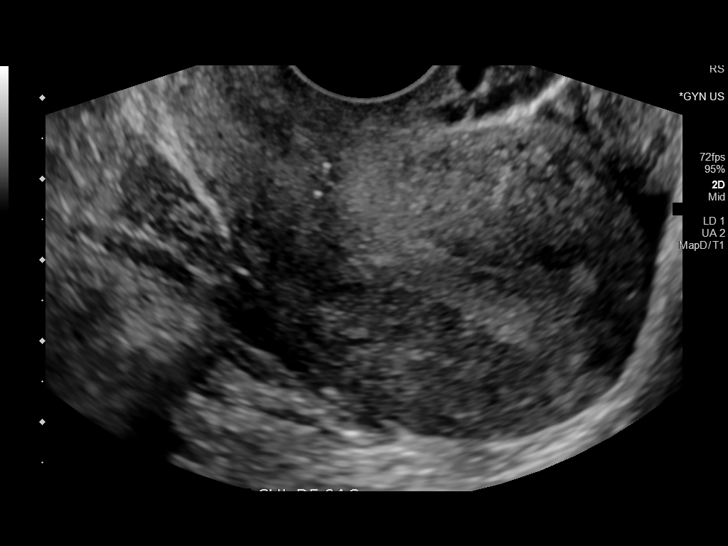
[im 84/84]
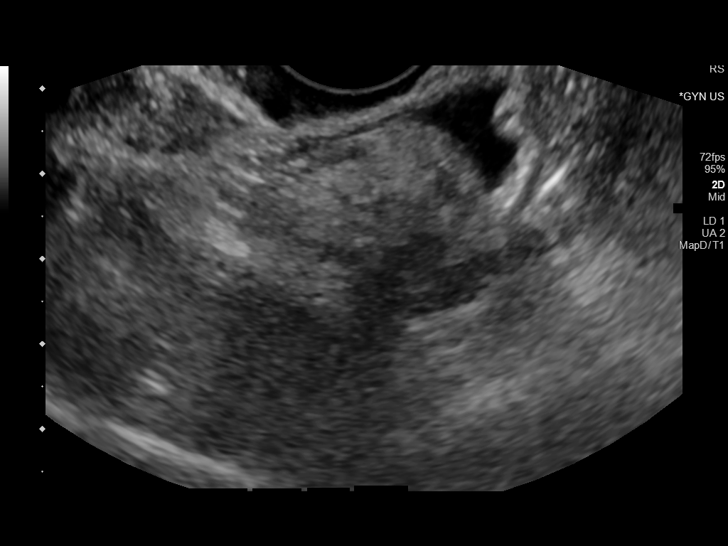

[13 of 25 positions shown; findings below may reference images not displayed]

FINDINGS: Uterus

Measurements: 6.5 x 4.1 x 4.5 cm = volume: 63.4 mL. No fibroids or
other mass visualized.

Endometrium

Thickness: 7 mm.  No focal abnormality visualized.

Right ovary

Measurements: 3.3 x 2.3 x 2.0 cm = volume: 7.9 mL. Normal
appearance/no adnexal mass.

Left ovary

Measurements: 2.8 x 1.8 x 1.6 cm = volume: 4.2 mL. Normal
appearance/no adnexal mass.

Pulsed Doppler evaluation of both ovaries demonstrates normal
low-resistance arterial and venous waveforms.

Other findings

There is a small to moderate amount of mildly complicated fluid in
the pelvis.
IMPRESSION: 1. The uterus, endometrium, and ovaries are normal in appearance.
2. There is a small to moderate amount of mildly complicated fluid
in the pelvis which may be physiologic. Given the mildly complicated
nature of the fluid, blood products from a ruptured follicle are a
possibility. Recommend clinical correlation.

## 2021-05-10 ENCOUNTER — Encounter: Payer: Self-pay | Admitting: Family Medicine

## 2021-05-10 ENCOUNTER — Ambulatory Visit (INDEPENDENT_AMBULATORY_CARE_PROVIDER_SITE_OTHER): Payer: 59 | Admitting: Family Medicine

## 2021-05-10 ENCOUNTER — Other Ambulatory Visit: Payer: Self-pay

## 2021-05-10 VITALS — BP 141/83 | HR 104 | Temp 98.0°F | Wt 152.0 lb

## 2021-05-10 DIAGNOSIS — H66001 Acute suppurative otitis media without spontaneous rupture of ear drum, right ear: Secondary | ICD-10-CM

## 2021-05-10 MED ORDER — AMOXICILLIN-POT CLAVULANATE 500-125 MG PO TABS
1.0000 | ORAL_TABLET | Freq: Two times a day (BID) | ORAL | 0 refills | Status: AC
Start: 2021-05-10 — End: 2021-05-17

## 2021-05-10 NOTE — Progress Notes (Signed)
Subjective:  ? ? Patient ID: Cheryl Stein, female    DOB: 12-29-1980, 41 y.o.   MRN: [id number] ? ?Chief Complaint  ?Patient presents with  ? Ear Fullness  ?  2 wks ago had a viral resp,  ?Started hurting yday, got worse as the day went on. Now feels full and can not hear. Does not hurt, just muffled and feels full.no other symptoms  ? ? ?HPI ?Patient was seen today for acute concern.  Pt had a viral resp infection 2 wks ago.  Seen at [facility], COVID, strep, and flu testing negative.  Pt states symptoms improved with mild residual nasal congestion.  She then developed mild ear discomfort and decreased/muffled hearing.  Pt denies HA, fever, sore throat, rhinorrhea, facial pain/pressure. ? ?Past Medical History:  ?Diagnosis Date  ? Allergy   ? AMA (advanced maternal age) multigravida 19+   ? Anemia   ? periodic  ? Anxiety   ? Clotting disorder ([place])   ? to prevent miscarage  ? Endometriosis   ? Family history of adverse reaction to anesthesia   ? mother has PONV  ? GERD (gastroesophageal reflux disease)   ? History of multiple miscarriages   ? Hx of varicella   ? Low blood pressure reading   ? PAI-1 4G/4G genotype   ? multiple miscarage and increases rish of DVT  ? ? ?Allergies  ?Allergen Reactions  ? Codeine Nausea And Vomiting  ? Latex Rash  ? ? ?ROS ?General: Denies fever, chills, night sweats, changes in weight, changes in appetite ?HEENT: Denies headaches, ear pain, changes in vision, rhinorrhea, sore throat + Decreased hearing R ear ?CV: Denies CP, palpitations, SOB, orthopnea ?Pulm: Denies SOB, cough, wheezing ?GI: Denies abdominal pain, nausea, vomiting, diarrhea, constipation ?GU: Denies dysuria, hematuria, frequency, vaginal discharge ?Msk: Denies muscle cramps, joint pains ?Neuro: Denies weakness, numbness, tingling ?Skin: Denies rashes, bruising ?Psych: Denies depression, anxiety, hallucinations ? ? ?   ?Objective:  ?  ?Blood pressure (!) 141/83, pulse (!) 104, temperature 98 ?F (36.7 ?C), temperature  source Oral, weight 152 lb (68.9 kg), SpO2 100 %. ? ?Gen. Pleasant, well-nourished, in no distress, normal affect   ?HEENT: [place]/AT, face symmetric, conjunctiva clear, no scleral icterus, PERRLA, EOMI, nares patent without drainage, pharynx without erythema or exudate.  L TM normal.  R TM dull in color, retracted, erythematous, with suppurative fluid. ?Lungs: no accessory muscle use, CTAB, no wheezes or rales ?Cardiovascular: RRR, no m/r/g, no peripheral edema ?Abdomen: BS present, soft, NT/ND, no hepatosplenomegaly. ?Musculoskeletal: No deformities, no cyanosis or clubbing, normal tone ?Neuro:  A&Ox3, CN II-XII intact, normal gait ?Skin:  Warm, no lesions/ rash ? ? ?Wt Readings from Last 3 Encounters:  ?05/10/21 152 lb (68.9 kg)  ?10/26/20 150 lb (68 kg)  ?09/17/20 148 lb 4 oz (67.2 kg)  ? ? ?Lab Results  ?Component Value Date  ? WBC 5.6 09/17/2020  ? HGB 12.6 09/17/2020  ? HCT 37.8 09/17/2020  ? PLT 243.0 09/17/2020  ? GLUCOSE 84 09/17/2020  ? CHOL 138 09/17/2020  ? TRIG 136.0 09/17/2020  ? HDL 53.20 09/17/2020  ? [place] 58 09/17/2020  ? ALT 10 09/17/2020  ? AST 13 09/17/2020  ? NA 136 09/17/2020  ? K 4.1 09/17/2020  ? CL 105 09/17/2020  ? CREATININE 0.81 09/17/2020  ? BUN 13 09/17/2020  ? CO2 24 09/17/2020  ? TSH 1.58 09/08/2019  ? HGBA1C 5.3 09/17/2020  ? ? ?Assessment/Plan: ? ?Acute suppurative otitis media of right ear without  spontaneous rupture of tympanic membrane, recurrence not specified  ?-New onset ear pain/decreased hearing x a few days after recent viral URI. ?-AOM ?-Start ABX ?-Tylenol as needed for pain/discomfort.  Flonase nasal spray or antihistamine for eustachian tube dysfunction ?- Plan: amoxicillin-clavulanate (AUGMENTIN) 500-125 MG tablet ? ?F/u as needed ? ?Grier Mitts, MD ?

## 2021-07-18 ENCOUNTER — Encounter: Payer: Self-pay | Admitting: Adult Health

## 2021-07-18 ENCOUNTER — Ambulatory Visit: Payer: 59 | Admitting: Adult Health

## 2021-07-18 VITALS — BP 100/60 | HR 102 | Temp 98.5°F | Ht 66.5 in | Wt 152.0 lb

## 2021-07-18 DIAGNOSIS — H6992 Unspecified Eustachian tube disorder, left ear: Secondary | ICD-10-CM

## 2021-07-18 NOTE — Progress Notes (Signed)
Subjective:    Patient ID: Cheryl Stein, female    DOB: [birth year], 41 y.o.   MRN: [id number]  HPI 41 year old female who  has a past medical history of Allergy, AMA (advanced maternal age) multigravida 35+, Anemia, Anxiety, Clotting disorder ([place]), Endometriosis, Family history of adverse reaction to anesthesia, GERD (gastroesophageal reflux disease), History of multiple miscarriages, varicella, Low blood pressure reading, and PAI-1 4G/4G genotype.  She presents to the office today for an acute issue of left ear tenderness.  She was treated by her PCP about 2 months ago for right sided ear infection.  Reports that over the last 2 days she started to develop some of her and her left ear with a slightly swollen and tender lymph node below her left ear.  She has had some seasonal allergy-like symptoms causing sinus pressure but no fevers, chills, drainage from her ears.  She has had some muffled hearing   Review of Systems See HPI   Past Medical History:  Diagnosis Date   Allergy    AMA (advanced maternal age) multigravida 35+    Anemia    periodic   Anxiety    Clotting disorder ([place])    to prevent miscarage   Endometriosis    Family history of adverse reaction to anesthesia    mother has PONV   GERD (gastroesophageal reflux disease)    History of multiple miscarriages    Hx of varicella    Low blood pressure reading    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    Social History   Socioeconomic History   Marital status: Married    Spouse name: Not on file   Number of children: Not on file   Years of education: Not on file   Highest education level: Not on file  Occupational History   Not on file  Tobacco Use   Smoking status: Never   Smokeless tobacco: Never  Vaping Use   Vaping Use: Never used  Substance and Sexual Activity   Alcohol use: Yes    Comment: occ   Drug use: No   Sexual activity: Yes    Comment: pt said she  knows she is not pregnant  Other Topics Concern   Not on file  Social History Narrative   Not on file   Social Determinants of Health   Financial Resource Strain: Not on file  Food Insecurity: Not on file  Transportation Needs: Not on file  Physical Activity: Not on file  Stress: Not on file  Social Connections: Not on file  Intimate Partner Violence: Not on file    Past Surgical History:  Procedure Laterality Date   CESAREAN SECTION  12/21/2010   Procedure: CESAREAN SECTION;  Surgeon: Luz Lex, MD;  Location: [facility];  Service: Gynecology;  Laterality: N/A;  Primary Cesarean Section with birth of baby boy @ 59   CHOLECYSTECTOMY N/A 08/18/2016   Procedure: LAPAROSCOPIC CHOLECYSTECTOMY WITH INTRAOPERATIVE CHOLANGIOGRAM;  Surgeon: Alphonsa Overall, MD;  Location: [facility];  Service: General;  Laterality: N/A;   DILATION AND EVACUATION     DILATION AND EVACUATION N/A 02/15/2016   Procedure: DILATATION AND EVACUATION (D&E) 2ND TRIMESTER WITH GENETIC STUDIES;  Surgeon: Louretta Shorten, MD;  Location: [facility];  Service: Gynecology;  Laterality: N/A;  ok per [facility] for this date and time   HYMENECTOMY     LAPAROSCOPY     WISDOM TOOTH EXTRACTION  Family History  Problem Relation Age of Onset   Hypertension Mother    Parkinson's disease Father    Birth defects Brother        anacephaly   Diabetes Maternal Grandfather    Diabetes Paternal Grandmother    Colon cancer Neg Hx    Esophageal cancer Neg Hx    Rectal cancer Neg Hx    Stomach cancer Neg Hx     Allergies  Allergen Reactions   Codeine Nausea And Vomiting   Latex Rash    Current Outpatient Medications on File Prior to Visit  Medication Sig Dispense Refill   amitriptyline (ELAVIL) 25 MG tablet Take 1 tablet (25 mg total) by mouth at bedtime. 90 tablet 3   dicyclomine (BENTYL) 10 MG capsule Take 1 capsule (10 mg total) by mouth 3 (three) times daily before meals. 90 capsule 2   dicyclomine (BENTYL) 10 MG/ML injection  Bentyl 10 mg capsule  Take 1 capsule 3 times a day by oral route.     heparin 5000 UNIT/ML injection Inject 5,000 Units into the skin in the morning and at bedtime. 7 days out of the month after ovulation     ondansetron (ZOFRAN) 4 MG tablet Take 1 tablet (4 mg total) by mouth every 8 (eight) hours as needed for nausea or vomiting. NEEDS OFFICE VISIT FOR FURTHER REFILLS 15 tablet 3   ondansetron (ZOFRAN-ODT) 8 MG disintegrating tablet ondansetron 8 mg disintegrating tablet  DISSOLVE ONE TABLET BY MOUTH EVERY 8 HOURS AS NEEDED FOR NAUSEA     No current facility-administered medications on file prior to visit.    BP 100/60   Pulse (!) 102   Temp 98.5 F (36.9 C) (Oral)   Ht 5' 6.5" (1.689 m)   Wt 152 lb (68.9 kg)   SpO2 99%   BMI 24.17 kg/m       Objective:   Physical Exam Vitals and nursing note reviewed.  Constitutional:      Appearance: Normal appearance.  HENT:     Right Ear: Hearing, tympanic membrane, ear canal and external ear normal. Tympanic membrane is not erythematous.     Left Ear: Hearing, ear canal and external ear normal. No decreased hearing noted. No drainage or swelling. A middle ear effusion is present. Tympanic membrane is bulging. Tympanic membrane is not erythematous.  Musculoskeletal:        General: Normal range of motion.  Skin:    General: Skin is warm and dry.  Neurological:     General: No focal deficit present.     Mental Status: She is alert and oriented to person, place, and time.  Psychiatric:        Mood and Affect: Mood normal.        Behavior: Behavior normal.        Thought Content: Thought content normal.        Judgment: Judgment normal.       Assessment & Plan:  1. Eustachian tube disorder, left -No signs of otitis media.  Appears to be more eustachian tube dysfunction.  Advised using Flonase and/or Afrin.  If she does use Afrin do not use longer than 3 days.  Follow-up as needed  Dorothyann Peng, NP

## 2021-09-18 ENCOUNTER — Encounter: Payer: 59 | Admitting: Family Medicine

## 2021-09-30 ENCOUNTER — Encounter: Payer: Self-pay | Admitting: Family Medicine

## 2021-09-30 ENCOUNTER — Ambulatory Visit (INDEPENDENT_AMBULATORY_CARE_PROVIDER_SITE_OTHER): Payer: 59 | Admitting: Family Medicine

## 2021-09-30 VITALS — BP 90/70 | HR 105 | Temp 98.2°F | Ht 66.75 in | Wt 152.4 lb

## 2021-09-30 DIAGNOSIS — D229 Melanocytic nevi, unspecified: Secondary | ICD-10-CM

## 2021-09-30 DIAGNOSIS — Z0000 Encounter for general adult medical examination without abnormal findings: Secondary | ICD-10-CM | POA: Diagnosis not present

## 2021-09-30 DIAGNOSIS — Z13220 Encounter for screening for lipoid disorders: Secondary | ICD-10-CM | POA: Diagnosis not present

## 2021-09-30 DIAGNOSIS — Z1159 Encounter for screening for other viral diseases: Secondary | ICD-10-CM | POA: Diagnosis not present

## 2021-09-30 LAB — BASIC METABOLIC PANEL
BUN: 12 mg/dL (ref 6–23)
CO2: 24 mEq/L (ref 19–32)
Calcium: 9.2 mg/dL (ref 8.4–10.5)
Chloride: 104 mEq/L (ref 96–112)
Creatinine, Ser: 0.77 mg/dL (ref 0.40–1.20)
GFR: 95.29 mL/min (ref >60.00)
Glucose, Bld: 84 mg/dL (ref 70–99)
Potassium: 4.5 mEq/L (ref 3.5–5.1)
Sodium: 136 mEq/L (ref 135–145)

## 2021-09-30 LAB — CBC WITH DIFFERENTIAL/PLATELET
Basophils Absolute: 0 10*3/uL (ref 0.0–0.1)
Basophils Relative: 0.9 % (ref 0.0–3.0)
Eosinophils Absolute: 0.2 10*3/uL (ref 0.0–0.7)
Eosinophils Relative: 3 % (ref 0.0–5.0)
HCT: 36.8 % (ref 36.0–46.0)
Hemoglobin: 11.7 g/dL — ABNORMAL LOW (ref 12.0–15.0)
Lymphocytes Relative: 27.2 % (ref 12.0–46.0)
Lymphs Abs: 1.4 10*3/uL (ref 0.7–4.0)
MCHC: 31.8 g/dL (ref 30.0–36.0)
MCV: 75.1 fl — ABNORMAL LOW (ref 78.0–100.0)
Monocytes Absolute: 0.4 10*3/uL (ref 0.1–1.0)
Monocytes Relative: 7.9 % (ref 3.0–12.0)
Neutro Abs: 3.1 10*3/uL (ref 1.4–7.7)
Neutrophils Relative %: 61 % (ref 43.0–77.0)
Platelets: 277 10*3/uL (ref 150.0–400.0)
RBC: 4.91 Mil/uL (ref 3.87–5.11)
RDW: 16.6 % — ABNORMAL HIGH (ref 11.5–15.5)
WBC: 5.1 10*3/uL (ref 4.0–10.5)

## 2021-09-30 LAB — LIPID PANEL
Cholesterol: 154 mg/dL (ref 0–200)
HDL: 57.8 mg/dL (ref >39.00)
LDL Cholesterol: 75 mg/dL (ref 0–99)
NonHDL: 96.44
Total CHOL/HDL Ratio: 3
Triglycerides: 109 mg/dL (ref 0.0–149.0)
VLDL: 21.8 mg/dL (ref 0.0–40.0)

## 2021-09-30 LAB — T4, FREE: Free T4: 0.93 ng/dL (ref 0.60–1.60)

## 2021-09-30 LAB — HEMOGLOBIN A1C: Hgb A1c MFr Bld: 5.6 % (ref 4.6–6.5)

## 2021-09-30 LAB — TSH: TSH: 1.83 u[IU]/mL (ref 0.35–5.50)

## 2021-09-30 NOTE — Progress Notes (Signed)
Subjective:     Cheryl Stein is a 41 y.o. female and is here for a comprehensive physical exam. The patient reports doing well overall.  Recently returned from vacation at [facility] with her family.  Pt followed by OB/Gyn for pap.  Mammogram done 01/28/21.  Has a mole on R upper chest present x several yrs, no changes in shape, no bleeding, occasionally gets irritated depending on clothes.  Previously seen by Derm who felt he did not need to be removed.  Patient states occasionally a little black dots appear on long.  Social History   Socioeconomic History   Marital status: Married    Spouse name: Not on file   Number of children: Not on file   Years of education: Not on file   Highest education level: Not on file  Occupational History   Not on file  Tobacco Use   Smoking status: Never   Smokeless tobacco: Never  Vaping Use   Vaping Use: Never used  Substance and Sexual Activity   Alcohol use: Yes    Comment: occ   Drug use: No   Sexual activity: Yes    Comment: pt said she knows she is not pregnant  Other Topics Concern   Not on file  Social History Narrative   Not on file   Social Determinants of Health   Financial Resource Strain: Not on file  Food Insecurity: Not on file  Transportation Needs: Not on file  Physical Activity: Not on file  Stress: Not on file  Social Connections: Not on file  Intimate Partner Violence: Not on file   Health Maintenance  Topic Date Due   Hepatitis C Screening  Never done   TETANUS/TDAP  Never done   PAP SMEAR-Modifier  10/30/2017   COVID-19 Vaccine (6 - Mixed Product series) 05/30/2020   INFLUENZA VACCINE  10/01/2021   HIV Screening  Completed   HPV VACCINES  Aged Out    The following portions of the patient's history were reviewed and updated as appropriate: allergies, current medications, past family history, past medical history, past social history, past surgical history, and problem list.  Review of  Systems Pertinent items noted in HPI and remainder of comprehensive ROS otherwise negative.   Objective:    BP 90/70 (BP Location: Left Arm, Patient Position: Sitting, Cuff Size: Normal)   Pulse (!) 105   Temp 98.2 F (36.8 C) (Oral)   Ht 5' 6.75" (1.695 m)   Wt 152 lb 6.4 oz (69.1 kg)   LMP 09/25/2021 (Exact Date)   SpO2 99%   BMI 24.05 kg/m  General appearance: alert, cooperative, and no distress Head: Normocephalic, without obvious abnormality, atraumatic Eyes: conjunctivae/corneas clear. PERRL, EOM's intact. Fundi benign. Ears: normal TM's and external ear canals both ears Nose: Nares normal. Septum midline. Mucosa normal. No drainage or sinus tenderness. Throat: lips, mucosa, and tongue normal; teeth and gums normal Neck: no adenopathy, no carotid bruit, no JVD, supple, symmetrical, trachea midline, and thyroid not enlarged, symmetric, no tenderness/mass/nodules Lungs: clear to auscultation bilaterally Heart: regular rate and rhythm, S1, S2 normal, no murmur, click, rub or gallop Abdomen: soft, non-tender; bowel sounds normal; no masses,  no organomegaly Extremities: extremities normal, atraumatic, no cyanosis or edema Pulses: 2+ and symmetric Skin: Warm, dry, intact.  A 7 mm raised nevi on right upper chest pinpoint hyperpigmented areas noted throughout.  No erythema, drainage or irritation noted Lymph nodes: Cervical, supraclavicular, and axillary nodes normal. Neurologic: Alert and oriented X  3, normal strength and tone. Normal symmetric reflexes. Normal coordination and gait    Assessment:    Healthy female exam.      Plan:    Anticipatory guidance given including wearing seatbelts, smoke detectors in the home, increasing physical activity, increasing p.o. intake of water and vegetables. -Labs -Mammogram done 01/21/2021 -Colonoscopy done 08/22/2020 -Pap done with OB/GYN -Immunizations reviewed -Given handout -Next CPE in 1 year See After Visit Summary for  Counseling Recommendations   - Plan: CBC with Differential/Platelet, Basic metabolic panel, TSH, T4, Free, Hemoglobin A1c, Lipid panel  Benign mole -Continue to monitor -Follow-up with Derm for removal desired  Screening for cholesterol level - Plan: Lipid panel  Encounter for hepatitis C screening test for low risk patient - Plan: Hep C Antibody  Follow-up as needed  Grier Mitts, MD

## 2021-10-01 LAB — HEPATITIS C ANTIBODY: Hepatitis C Ab: NONREACTIVE

## 2021-12-17 ENCOUNTER — Telehealth: Payer: Self-pay | Admitting: Gastroenterology

## 2021-12-17 DIAGNOSIS — K582 Mixed irritable bowel syndrome: Secondary | ICD-10-CM

## 2021-12-17 NOTE — Telephone Encounter (Signed)
Patient to schedule her follow up appointment for her medication. Appt is scheduled fot 12-6 but would like a refill on medication before then. Requesting a call if possible. Please call to advise.

## 2021-12-18 MED ORDER — DICYCLOMINE HCL 10 MG PO CAPS: 10.0000 mg | ORAL_CAPSULE | Freq: Three times a day (TID) | ORAL | 0 refills | Status: DC

## 2021-12-18 MED ORDER — AMITRIPTYLINE HCL 25 MG PO TABS: 25.0000 mg | ORAL_TABLET | Freq: Every day | ORAL | 1 refills | Status: DC

## 2021-12-18 MED ORDER — ONDANSETRON HCL 4 MG PO TABS
4.0000 mg | ORAL_TABLET | Freq: Three times a day (TID) | ORAL | 0 refills | Status: DC | PRN
Start: 2021-12-18 — End: 2022-08-04

## 2021-12-18 NOTE — Telephone Encounter (Signed)
Left message for patient to return my call.

## 2021-12-18 NOTE — Telephone Encounter (Signed)
Informed patient we can send refills to her pharmacy until her scheduled appt in December. Patient requested amitriptyline, zofran and dicyclomine be refilled. Prescriptions sent to patient's pharmacy.

## 2022-01-10 ENCOUNTER — Other Ambulatory Visit: Payer: Self-pay | Admitting: Obstetrics and Gynecology

## 2022-01-10 DIAGNOSIS — R921 Mammographic calcification found on diagnostic imaging of breast: Secondary | ICD-10-CM

## 2022-01-30 ENCOUNTER — Ambulatory Visit
Admission: RE | Admit: 2022-01-30 | Discharge: 2022-01-30 | Disposition: A | Discharge status: Home / Self Care | Payer: 59 | Source: Ambulatory Visit | Attending: Obstetrics and Gynecology | Admitting: Obstetrics and Gynecology

## 2022-01-30 DIAGNOSIS — R921 Mammographic calcification found on diagnostic imaging of breast: Secondary | ICD-10-CM

## 2022-02-05 ENCOUNTER — Encounter: Payer: Self-pay | Admitting: Gastroenterology

## 2022-02-05 ENCOUNTER — Ambulatory Visit (INDEPENDENT_AMBULATORY_CARE_PROVIDER_SITE_OTHER): Payer: 59 | Admitting: Gastroenterology

## 2022-02-05 DIAGNOSIS — K582 Mixed irritable bowel syndrome: Secondary | ICD-10-CM | POA: Diagnosis not present

## 2022-02-05 MED ORDER — AMITRIPTYLINE HCL 25 MG PO TABS: 25.0000 mg | ORAL_TABLET | Freq: Every day | ORAL | 3 refills | Status: DC

## 2022-02-05 MED ORDER — DICYCLOMINE HCL 10 MG PO CAPS: 10.0000 mg | ORAL_CAPSULE | Freq: Three times a day (TID) | ORAL | 3 refills | Status: DC

## 2022-02-05 NOTE — Progress Notes (Signed)
    Assessment     IBS well controlled GERD with LA Grade A esophagitis, asymptomatic   Recommendations    Continue amitriptyline 25 mg at bedtime Continue dicyclomine 10 mg p.o. 3 times daily ac as needed Avoid foods and beverages that exacerbate symptoms REV in 1 year with Dr. Hilarie Fredrickson    HPI    This is a 41 year old female patient followed by Dr. Hilarie Fredrickson with IBS.  Her abdominal pain and diarrhea have substantially improved after beginning amitriptyline.  She states she has occasional episodes of abdominal pain with urgent, looser stools which are helped with dicyclomine.  The frequency and intensity of her abdominal pain and diarrhea has substantially decreased on amitriptyline.  She has no active reflux symptoms and is not on medications for reflux at this time.  I performed a colonoscopy in June 2022 which was normal to the terminal ileum and random colon biopsies were normal.  Apparently a follow-up appointment was scheduled with me.    Labs / Imaging       Latest Ref Rng & Units 09/17/2020    8:31 AM 09/08/2019   12:13 PM 06/12/2019    5:55 AM  Hepatic Function  Total Protein 6.0 - 8.3 g/dL 6.9  7.4  7.1   Albumin 3.5 - 5.2 g/dL 4.4  4.6  3.9   AST 0 - 37 U/L 13  14  64   ALT 0 - 35 U/L 10  11  195   Alk Phosphatase 39 - 117 U/L 43  40  100   Total Bilirubin 0.2 - 1.2 mg/dL 0.6  0.5  0.7        Latest Ref Rng & Units 09/30/2021    8:28 AM 09/17/2020    8:31 AM 09/08/2019   12:13 PM  CBC  WBC 4.0 - 10.5 K/uL 5.1  5.6  7.5   Hemoglobin 12.0 - 15.0 g/dL 11.7  12.6  13.2   Hematocrit 36.0 - 46.0 % 36.8  37.8  39.8   Platelets 150.0 - 400.0 K/uL 277.0  243.0  249.0     Current Medications, Allergies, Past Medical History, Past Surgical History, Family History and Social History were reviewed in [employer] record.   Physical Exam: General: Well developed, well nourished, no acute distress Head: Normocephalic and atraumatic Eyes: Sclerae anicteric,  EOMI Ears: Normal auditory acuity Mouth: No deformities or lesions noted Lungs: Clear throughout to auscultation Heart: Regular rate and rhythm; No murmurs, rubs or bruits Abdomen: Soft, non tender and non distended. No masses, hepatosplenomegaly or hernias noted. Normal Bowel sounds Rectal: Not done Musculoskeletal: Symmetrical with no gross deformities  Pulses:  Normal pulses noted Extremities: No edema or deformities noted Neurological: Alert oriented x 4, grossly nonfocal Psychological:  Alert and cooperative. Normal mood and affect   Jeanise Durfey T. Fuller Plan, MD 02/05/2022, 10:35 AM

## 2022-02-05 NOTE — Patient Instructions (Signed)
We have sent the following medications to your pharmacy for you to pick up at your convenience: Dicyclomine and amitriptyline  _______________________________________________________  If you are age 42 or older, your body mass index should be between 23-30. Your Body mass index is 23.87 kg/m. If this is out of the aforementioned range listed, please consider follow up with your Primary Care Provider.  If you are age 4 or younger, your body mass index should be between 19-25. Your Body mass index is 23.87 kg/m. If this is out of the aformentioned range listed, please consider follow up with your Primary Care Provider.   ________________________________________________________  The [facility] providers would like to encourage you to use [facility] to communicate with providers for non-urgent requests or questions.  Due to long hold times on the telephone, sending your provider a message by [facility] may be a faster and more efficient way to get a response.  Please allow 48 business hours for a response.  Please remember that this is for non-urgent requests.  _______________________________________________________   Due to recent changes in healthcare laws, you may see the results of your imaging and laboratory studies on MyChart before your provider has had a chance to review them.  We understand that in some cases there may be results that are confusing or concerning to you. Not all laboratory results come back in the same time frame and the provider may be waiting for multiple results in order to interpret others.  Please give [place] 48 hours in order for your provider to thoroughly review all the results before contacting the office for clarification of your results.    Please follow up with either Dr Zenovia Jarred or Dr Fuller Plan in a year.  Thank you for choosing me and [facility].  Pricilla Riffle. Dagoberto Ligas., MD., Marval Regal

## 2022-02-13 ENCOUNTER — Other Ambulatory Visit: Payer: Self-pay | Admitting: Physician Assistant

## 2022-08-04 ENCOUNTER — Other Ambulatory Visit: Payer: Self-pay | Admitting: Physician Assistant

## 2022-10-02 ENCOUNTER — Encounter: Payer: Self-pay | Admitting: Family Medicine

## 2022-10-02 ENCOUNTER — Ambulatory Visit (INDEPENDENT_AMBULATORY_CARE_PROVIDER_SITE_OTHER): Payer: 59 | Admitting: Family Medicine

## 2022-10-02 VITALS — BP 118/74 | HR 119 | Temp 98.5°F | Ht 63.5 in | Wt 159.0 lb

## 2022-10-02 DIAGNOSIS — Z0000 Encounter for general adult medical examination without abnormal findings: Secondary | ICD-10-CM

## 2022-10-02 LAB — COMPREHENSIVE METABOLIC PANEL
ALT: 16 U/L (ref 0–35)
AST: 19 U/L (ref 0–37)
Albumin: 4.6 g/dL (ref 3.5–5.2)
Alkaline Phosphatase: 52 U/L (ref 39–117)
BUN: 12 mg/dL (ref 6–23)
CO2: 25 mEq/L (ref 19–32)
Calcium: 9.8 mg/dL (ref 8.4–10.5)
Chloride: 103 mEq/L (ref 96–112)
Creatinine, Ser: 0.74 mg/dL (ref 0.40–1.20)
GFR: 99.25 mL/min (ref >60.00)
Glucose, Bld: 75 mg/dL (ref 70–99)
Potassium: 4.3 mEq/L (ref 3.5–5.1)
Sodium: 136 mEq/L (ref 135–145)
Total Bilirubin: 0.6 mg/dL (ref 0.2–1.2)
Total Protein: 7.8 g/dL (ref 6.0–8.3)

## 2022-10-02 LAB — CBC WITH DIFFERENTIAL/PLATELET
Basophils Absolute: 0 10*3/uL (ref 0.0–0.1)
Basophils Relative: 0.8 % (ref 0.0–3.0)
Eosinophils Absolute: 0.2 10*3/uL (ref 0.0–0.7)
Eosinophils Relative: 2.5 % (ref 0.0–5.0)
HCT: 39.3 % (ref 36.0–46.0)
Hemoglobin: 12.5 g/dL (ref 12.0–15.0)
Lymphocytes Relative: 24.9 % (ref 12.0–46.0)
Lymphs Abs: 1.5 10*3/uL (ref 0.7–4.0)
MCHC: 31.7 g/dL (ref 30.0–36.0)
MCV: 78 fl (ref 78.0–100.0)
Monocytes Absolute: 0.4 10*3/uL (ref 0.1–1.0)
Monocytes Relative: 7.2 % (ref 3.0–12.0)
Neutro Abs: 4 10*3/uL (ref 1.4–7.7)
Neutrophils Relative %: 64.6 % (ref 43.0–77.0)
Platelets: 298 10*3/uL (ref 150.0–400.0)
RBC: 5.04 Mil/uL (ref 3.87–5.11)
RDW: 16.4 % — ABNORMAL HIGH (ref 11.5–15.5)
WBC: 6.2 10*3/uL (ref 4.0–10.5)

## 2022-10-02 LAB — LIPID PANEL
Cholesterol: 172 mg/dL (ref 0–200)
HDL: 58.7 mg/dL (ref >39.00)
LDL Cholesterol: 95 mg/dL (ref 0–99)
NonHDL: 113.31
Total CHOL/HDL Ratio: 3
Triglycerides: 93 mg/dL (ref 0.0–149.0)
VLDL: 18.6 mg/dL (ref 0.0–40.0)

## 2022-10-02 LAB — HEMOGLOBIN A1C: Hgb A1c MFr Bld: 5.6 % (ref 4.6–6.5)

## 2022-10-02 LAB — T4, FREE: Free T4: 0.94 ng/dL (ref 0.60–1.60)

## 2022-10-02 LAB — TSH: TSH: 2.13 u[IU]/mL (ref 0.35–5.50)

## 2022-10-02 NOTE — Progress Notes (Signed)
Established Patient Office Visit   Subjective  Patient ID: Cheryl Stein, female    DOB: Aug 25, 1980  Age: 42 y.o. MRN: [id number]  Chief Complaint  Patient presents with   Annual Exam    Patient is a 42 year old female seen for CPE.  Patient states she has been doing well overall and is without concerns.  Patient notes the death of her father in 05/31/22.  States she and the family are not doing okay.  Patient notes mild weight gain.  Plans to get back to regular diet and exercise.    Patient Active Problem List   Diagnosis Date Noted   Irritable bowel syndrome with constipation 05/09/2020   Pulmonary nodule 10/26/2019   Palpitations 08/27/2018   Encounter for trial of labor 09/18/2014   Past Surgical History:  Procedure Laterality Date   CESAREAN SECTION  12/21/2010   Procedure: CESAREAN SECTION;  Surgeon: Turner Daniels, MD;  Location: [facility];  Service: Gynecology;  Laterality: N/A;  Primary Cesarean Section with birth of baby boy @ 53   CHOLECYSTECTOMY N/A 08/18/2016   Procedure: LAPAROSCOPIC CHOLECYSTECTOMY WITH INTRAOPERATIVE CHOLANGIOGRAM;  Surgeon: Ovidio Kin, MD;  Location: [facility];  Service: General;  Laterality: N/A;   DILATION AND EVACUATION     DILATION AND EVACUATION N/A 02/15/2016   Procedure: DILATATION AND EVACUATION (D&E) 2ND TRIMESTER WITH GENETIC STUDIES;  Surgeon: Candice Camp, MD;  Location: [facility];  Service: Gynecology;  Laterality: N/A;  ok per Chasity for this date and time   HYMENECTOMY     LAPAROSCOPY     WISDOM TOOTH EXTRACTION     Social History   Tobacco Use   Smoking status: Never   Smokeless tobacco: Never  Vaping Use   Vaping status: Never Used  Substance Use Topics   Alcohol use: Yes    Comment: occ   Drug use: No   Family History  Problem Relation Age of Onset   Hypertension Mother    Parkinson's disease Father    Kidney cancer Maternal Uncle    Diabetes Maternal Grandfather    Diabetes Paternal Grandmother    Birth  defects Brother        anacephaly   Colon cancer Neg Hx    Esophageal cancer Neg Hx    Rectal cancer Neg Hx    Stomach cancer Neg Hx    Allergies  Allergen Reactions   Codeine Nausea And Vomiting   Latex Rash      ROS Negative unless stated above    Objective:     BP 118/74 (BP Location: Left Arm, Patient Position: Sitting, Cuff Size: Normal)   Pulse (!) 119   Temp 98.5 F (36.9 C) (Oral)   Ht 5' 3.5" (1.613 m)   Wt 159 lb (72.1 kg)   SpO2 98%   BMI 27.72 kg/m  BP Readings from Last 3 Encounters:  10/02/22 118/74  02/05/22 102/68  09/30/21 90/70   Wt Readings from Last 3 Encounters:  10/02/22 159 lb (72.1 kg)  02/05/22 152 lb 6.4 oz (69.1 kg)  09/30/21 152 lb 6.4 oz (69.1 kg)      Physical Exam Constitutional:      Appearance: Normal appearance.  HENT:     Head: Normocephalic and atraumatic.     Right Ear: Tympanic membrane, ear canal and external ear normal.     Left Ear: Tympanic membrane, ear canal and external ear normal.     Nose: Nose normal.     Mouth/Throat:  Mouth: Mucous membranes are moist.     Pharynx: No oropharyngeal exudate or posterior oropharyngeal erythema.  Eyes:     General: No scleral icterus.    Extraocular Movements: Extraocular movements intact.     Conjunctiva/sclera: Conjunctivae normal.     Pupils: Pupils are equal, round, and reactive to light.  Neck:     Thyroid: No thyromegaly.  Cardiovascular:     Rate and Rhythm: Normal rate and regular rhythm.     Pulses: Normal pulses.     Heart sounds: Normal heart sounds. No murmur heard.    No friction rub.  Pulmonary:     Effort: Pulmonary effort is normal.     Breath sounds: Normal breath sounds. No wheezing, rhonchi or rales.  Abdominal:     General: Bowel sounds are normal.     Palpations: Abdomen is soft.     Tenderness: There is no abdominal tenderness.  Musculoskeletal:        General: No deformity. Normal range of motion.  Lymphadenopathy:     Cervical: No  cervical adenopathy.  Skin:    General: Skin is warm and dry.     Findings: No lesion.  Neurological:     General: No focal deficit present.     Mental Status: She is alert and oriented to person, place, and time.  Psychiatric:        Mood and Affect: Mood normal.        Thought Content: Thought content normal.      No results found for any visits on 10/02/22.    Assessment & Plan:  Well adult exam -     CBC with Differential/Platelet -     TSH -     T4, free -     Hemoglobin A1c -     Lipid panel -     Comprehensive metabolic panel  Age-appropriate health screenings discussed.  Will obtain labs.  Immunizations reviewed.  Patient to schedule Pap with OB/GYN.  Mammogram up-to-date.  Colonoscopy done 08/22/2020.  Next CPE in 1 year.  No follow-ups on file.   Deeann Saint, MD

## 2023-01-08 ENCOUNTER — Other Ambulatory Visit: Payer: Self-pay | Admitting: Obstetrics and Gynecology

## 2023-01-08 DIAGNOSIS — Z1231 Encounter for screening mammogram for malignant neoplasm of breast: Secondary | ICD-10-CM

## 2023-01-30 ENCOUNTER — Other Ambulatory Visit: Payer: Self-pay | Admitting: Gastroenterology

## 2023-02-05 ENCOUNTER — Ambulatory Visit
Admission: RE | Admit: 2023-02-05 | Discharge: 2023-02-05 | Disposition: A | Discharge status: Home / Self Care | Payer: 59 | Source: Ambulatory Visit | Attending: Obstetrics and Gynecology | Admitting: Obstetrics and Gynecology

## 2023-02-05 DIAGNOSIS — Z1231 Encounter for screening mammogram for malignant neoplasm of breast: Secondary | ICD-10-CM

## 2023-03-15 ENCOUNTER — Other Ambulatory Visit: Payer: Self-pay | Admitting: Physician Assistant

## 2023-05-11 ENCOUNTER — Telehealth: Payer: Self-pay | Admitting: Gastroenterology

## 2023-05-11 NOTE — Telephone Encounter (Signed)
 ok

## 2023-05-11 NOTE — Telephone Encounter (Signed)
 Previous Dr. Russella Dar patient that was last seen on 02/05/22. Patient is requesting a refill of amitriptyline and zofran be sent to her pharmacy. Patient has an appt scheduled with Celso Amy, PA on 06/23/23. You are DOD Dr. Rhea Belton, please advise.

## 2023-05-11 NOTE — Telephone Encounter (Signed)
 Inbound call from patient requesting a refill for Zofran and Amitriptyline. Patient is scheduled for 4/22. Please advise, thank you.

## 2023-05-12 MED ORDER — AMITRIPTYLINE HCL 25 MG PO TABS: 25.0000 mg | ORAL_TABLET | Freq: Every day | ORAL | 0 refills | Status: DC

## 2023-05-12 MED ORDER — ONDANSETRON HCL 4 MG PO TABS: 4.0000 mg | ORAL_TABLET | Freq: Three times a day (TID) | ORAL | 0 refills | Status: DC | PRN

## 2023-05-12 NOTE — Telephone Encounter (Signed)
Prescriptions sent to patient's pharmacy until scheduled appt. 

## 2023-06-23 ENCOUNTER — Ambulatory Visit: Admitting: Physician Assistant

## 2023-07-20 NOTE — Progress Notes (Signed)
 Cheryl Canard, PA-C [address] [place], [place]  [address] Phone: [phone]   Primary Care Physician: Cheryl Greulich, MD  Primary Gastroenterologist:  Cheryl Canard, PA-C / Dr. Laurell Stein   Chief Complaint:  F/U IBS and GERD; Med Refill       HPI:   Cheryl Stein is a 43 y.o. female, previous patient of Dr. Sandrea Stein, returns for annual follow-up of irritable bowel syndrome and GERD with LA grade A esophagitis.  Needs medication refill.  She has been taking amitriptyline  25 Mg nightly, dicyclomine  10 Mg 3 times daily with benefit.  She is requesting to establish care with Dr. Bridgett Camps since Dr. Sandrea Stein retired.  Current Symptoms: She is doing extremely well on current treatment.  Her IBS symptoms have greatly improved and are controlled on amitriptyline  25 Mg nightly.  She has rare episode of flareup of IBS once every few months which is relieved with dicyclomine  10 Mg and Zofran  4 Mg as needed.  Last flare of IBS was March 2025.  She is not currently taking PPI or H2 RB.  Acid reflux has improved with dietary modification.  She denies any current GI symptoms such as abdominal pain, heartburn, dysphagia, bowel irregularities, or rectal bleeding.  08/2020 Colonoscopy by Dr. Sandrea Stein: Normal.  No polyps.  Excellent prep.  10-year repeat.  Biopsies negative for microscopic colitis and IBD.  01/2018 EGD: LA grade a esophagitis.  Normal stomach and duodenum.  Biopsy showed chronic inactive gastritis.  Biopsies negative for celiac and H. pylori.  Current Outpatient Medications  Medication Sig Dispense Refill   amitriptyline  (ELAVIL ) 25 MG tablet Take 1 tablet (25 mg total) by mouth at bedtime. 90 tablet 3   dicyclomine  (BENTYL ) 10 MG capsule Take 1 capsule (10 mg total) by mouth daily. 90 capsule 3   ondansetron  (ZOFRAN ) 4 MG tablet Take 1 tablet (4 mg total) by mouth every 8 (eight) hours as needed. 30 tablet 2   No current facility-administered medications for this visit.     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    Past Surgical History:  Procedure Laterality Date   CESAREAN SECTION  12/21/2010   Procedure: CESAREAN SECTION;  Surgeon: Cheryl Finner, MD;  Location: [facility];  Service: Gynecology;  Laterality: N/A;  Primary Cesarean Section with birth of baby boy @ 63   CHOLECYSTECTOMY N/A 08/18/2016   Procedure: LAPAROSCOPIC CHOLECYSTECTOMY WITH INTRAOPERATIVE CHOLANGIOGRAM;  Surgeon: Cheryl Norlander, MD;  Location: [facility];  Service: General;  Laterality: N/A;   DILATION AND EVACUATION     DILATION AND EVACUATION N/A 02/15/2016   Procedure: DILATATION AND EVACUATION (D&E) 2ND TRIMESTER WITH GENETIC STUDIES;  Surgeon: Cheryl Box, MD;  Location: [facility];  Service: Gynecology;  Laterality: N/A;  ok per Cheryl Stein for this date and time   HYMENECTOMY     LAPAROSCOPY     WISDOM TOOTH EXTRACTION      Review of Systems:    All systems reviewed and negative except where noted in HPI.    Physical Exam:  BP 118/68   Pulse (!) 109   Ht 5\' 6"  (1.676 m)   Wt 165 lb (74.8 kg)   BMI 26.63 kg/m  No LMP recorded. (Menstrual status: Irregular Periods).  General: Well-nourished, well-developed in no acute distress.  Lungs: Clear to auscultation bilaterally. Non-labored. Heart: Regular rate and rhythm, no murmurs rubs or gallops.  Abdomen: Bowel sounds are  normal; Abdomen is Soft; No hepatosplenomegaly, masses or hernias;  No Abdominal Tenderness; No guarding or rebound tenderness. Neuro: Alert and oriented x 3.  Grossly intact.  Psych: Alert and cooperative, normal mood and affect.   Imaging Studies: No results found.  Labs: CBC    Component Value Date/Time   WBC 6.2 10/02/2022 1000   RBC 5.04 10/02/2022 1000   HGB 12.5 10/02/2022 1000   HCT 39.3 10/02/2022 1000   PLT 298.0 10/02/2022 1000   MCV 78.0 10/02/2022 1000   MCH 27.7 06/12/2019 0555   MCHC 31.7 10/02/2022 1000   RDW 16.4 (H) 10/02/2022 1000   LYMPHSABS 1.5 10/02/2022 1000   MONOABS 0.4 10/02/2022 1000   EOSABS 0.2 10/02/2022 1000   BASOSABS 0.0 10/02/2022 1000    CMP     Component Value Date/Time   NA 136 10/02/2022 1000   K 4.3 10/02/2022 1000   CL 103 10/02/2022 1000   CO2 25 10/02/2022 1000   GLUCOSE 75 10/02/2022 1000   BUN 12 10/02/2022 1000   CREATININE 0.74 10/02/2022 1000   CALCIUM 9.8 10/02/2022 1000   PROT 7.8 10/02/2022 1000   ALBUMIN 4.6 10/02/2022 1000   AST 19 10/02/2022 1000   ALT 16 10/02/2022 1000   ALKPHOS 52 10/02/2022 1000   BILITOT 0.6 10/02/2022 1000   GFRNONAA >60 06/12/2019 0555   GFRAA >60 06/12/2019 0555       Assessment and Plan:   Cheryl Stein is a 43 y.o. y/o female returns for followup of:  Irritable bowel syndrome -stable and controlled on current treatment - Rx amitriptyline  25 Mg nightly, #90, 3 refills. - Rx dicyclomine  10 Mg once or twice daily as needed abdominal cramping, #90, 3 refills. - Rx Zofran  4 Mg every 6 hours as needed, #30, 2 refills. - Gave copy of low FODMAP diet.  GERD with mild esophagitis -improved with dietary modification - Continue Lifestyle Modifications to prevent Acid Reflux.  Rec. Avoid coffee, sodas, peppermint, garlic, onions, alcohol, citrus fruits, chocolate, tomatoes, fatty and spicey foods.  Avoid eating 2-3 hours before bedtime.   - If she has flareup of acid reflux,  then she will take OTC antacid, Pepcid , or Prilosec as needed.  3.  Colon cancer screening -is up-to-date - Negative colonoscopy 08/2020. - 10-year repeat screening colonoscopy will be due 08/2030.  Cheryl Canard, PA-C  Follow up in 1 year or sooner if she has flareup of GI symptoms.

## 2023-07-21 ENCOUNTER — Encounter: Payer: Self-pay | Admitting: Physician Assistant

## 2023-07-21 ENCOUNTER — Ambulatory Visit (INDEPENDENT_AMBULATORY_CARE_PROVIDER_SITE_OTHER): Admitting: Physician Assistant

## 2023-07-21 VITALS — BP 118/68 | HR 109 | Ht 66.0 in | Wt 165.0 lb

## 2023-07-21 DIAGNOSIS — K2100 Gastro-esophageal reflux disease with esophagitis, without bleeding: Secondary | ICD-10-CM

## 2023-07-21 DIAGNOSIS — K582 Mixed irritable bowel syndrome: Secondary | ICD-10-CM

## 2023-07-21 DIAGNOSIS — K589 Irritable bowel syndrome without diarrhea: Secondary | ICD-10-CM

## 2023-07-21 DIAGNOSIS — R110 Nausea: Secondary | ICD-10-CM

## 2023-07-21 DIAGNOSIS — K219 Gastro-esophageal reflux disease without esophagitis: Secondary | ICD-10-CM

## 2023-07-21 MED ORDER — ONDANSETRON HCL 4 MG PO TABS: 4.0000 mg | ORAL_TABLET | Freq: Three times a day (TID) | ORAL | 2 refills | Status: DC | PRN

## 2023-07-21 MED ORDER — AMITRIPTYLINE HCL 25 MG PO TABS: 25.0000 mg | ORAL_TABLET | Freq: Every day | ORAL | 3 refills | Status: DC

## 2023-07-21 MED ORDER — DICYCLOMINE HCL 10 MG PO CAPS: 10.0000 mg | ORAL_CAPSULE | Freq: Every day | ORAL | 3 refills | Status: AC

## 2023-07-21 NOTE — Patient Instructions (Addendum)
 We have sent the following medications to your pharmacy for you to pick up at your convenience: Ondansetron  4 mg every 8 hours as needed, Dicyclomine  10 mg daily and Amitriptyline  25 mg at bedtime  Please follow up sooner if symptoms increase or worsen  Due to recent changes in healthcare laws, you may see the results of your imaging and laboratory studies on MyChart before your provider has had a chance to review them.  We understand that in some cases there may be results that are confusing or concerning to you. Not all laboratory results come back in the same time frame and the provider may be waiting for multiple results in order to interpret others.  Please give us  48 hours in order for your provider to thoroughly review all the results before contacting the office for clarification of your results.   _______________________________________________________  If your blood pressure at your visit was 140/90 or greater, please contact your primary care physician to follow up on this.  _______________________________________________________  If you are age 90 or older, your body mass index should be between 23-30. Your Body mass index is 26.63 kg/m. If this is out of the aforementioned range listed, please consider follow up with your Primary Care Provider.  If you are age 44 or younger, your body mass index should be between 19-25. Your Body mass index is 26.63 kg/m. If this is out of the aformentioned range listed, please consider follow up with your Primary Care Provider.   ________________________________________________________  The [facility] providers would like to encourage you to use MYCHART to communicate with providers for non-urgent requests or questions.  Due to long hold times on the telephone, sending your provider a message by [facility] may be a faster and more efficient way to get a response.  Please allow 48 business hours for a response.  Please remember that this is for  non-urgent requests.  _______________________________________________________ Thank you for trusting me with your gastrointestinal care!   Brigitte Canard, PA-C

## 2023-07-23 NOTE — Progress Notes (Signed)
 Addendum: Reviewed and agree with assessment and management plan. Asha Grumbine, Carie Caddy, MD

## 2023-08-06 ENCOUNTER — Other Ambulatory Visit: Payer: Self-pay | Admitting: Internal Medicine

## 2023-08-06 DIAGNOSIS — K582 Mixed irritable bowel syndrome: Secondary | ICD-10-CM

## 2023-10-05 ENCOUNTER — Ambulatory Visit (INDEPENDENT_AMBULATORY_CARE_PROVIDER_SITE_OTHER): Admitting: Family Medicine

## 2023-10-05 VITALS — BP 112/74 | HR 103 | Temp 97.9°F | Ht 66.0 in | Wt 167.8 lb

## 2023-10-05 DIAGNOSIS — M67449 Ganglion, unspecified hand: Secondary | ICD-10-CM | POA: Diagnosis not present

## 2023-10-05 DIAGNOSIS — K581 Irritable bowel syndrome with constipation: Secondary | ICD-10-CM

## 2023-10-05 DIAGNOSIS — Z0000 Encounter for general adult medical examination without abnormal findings: Secondary | ICD-10-CM | POA: Diagnosis not present

## 2023-10-05 LAB — COMPREHENSIVE METABOLIC PANEL WITH GFR
ALT: 18 U/L (ref 0–35)
AST: 16 U/L (ref 0–37)
Albumin: 4.3 g/dL (ref 3.5–5.2)
Alkaline Phosphatase: 49 U/L (ref 39–117)
BUN: 13 mg/dL (ref 6–23)
CO2: 27 meq/L (ref 19–32)
Calcium: 9 mg/dL (ref 8.4–10.5)
Chloride: 103 meq/L (ref 96–112)
Creatinine, Ser: 0.69 mg/dL (ref 0.40–1.20)
GFR: 105.71 mL/min (ref >60.00)
Glucose, Bld: 88 mg/dL (ref 70–99)
Potassium: 4 meq/L (ref 3.5–5.1)
Sodium: 137 meq/L (ref 135–145)
Total Bilirubin: 0.4 mg/dL (ref 0.2–1.2)
Total Protein: 7.2 g/dL (ref 6.0–8.3)

## 2023-10-05 LAB — CBC WITH DIFFERENTIAL/PLATELET
Basophils Absolute: 0 K/uL (ref 0.0–0.1)
Basophils Relative: 0.8 % (ref 0.0–3.0)
Eosinophils Absolute: 0.1 K/uL (ref 0.0–0.7)
Eosinophils Relative: 2.7 % (ref 0.0–5.0)
HCT: 35.7 % — ABNORMAL LOW (ref 36.0–46.0)
Hemoglobin: 11.3 g/dL — ABNORMAL LOW (ref 12.0–15.0)
Lymphocytes Relative: 28 % (ref 12.0–46.0)
Lymphs Abs: 1.2 K/uL (ref 0.7–4.0)
MCHC: 31.8 g/dL (ref 30.0–36.0)
MCV: 74.8 fl — ABNORMAL LOW (ref 78.0–100.0)
Monocytes Absolute: 0.3 K/uL (ref 0.1–1.0)
Monocytes Relative: 7.1 % (ref 3.0–12.0)
Neutro Abs: 2.7 K/uL (ref 1.4–7.7)
Neutrophils Relative %: 61.4 % (ref 43.0–77.0)
Platelets: 283 K/uL (ref 150.0–400.0)
RBC: 4.77 Mil/uL (ref 3.87–5.11)
RDW: 14.6 % (ref 11.5–15.5)
WBC: 4.4 K/uL (ref 4.0–10.5)

## 2023-10-05 LAB — VITAMIN D 25 HYDROXY (VIT D DEFICIENCY, FRACTURES): VITD: 31.5 ng/mL (ref 30.00–100.00)

## 2023-10-05 LAB — HEMOGLOBIN A1C: Hgb A1c MFr Bld: 5.9 % (ref 4.6–6.5)

## 2023-10-05 LAB — LIPID PANEL
Cholesterol: 159 mg/dL (ref 0–200)
HDL: 49.9 mg/dL (ref >39.00)
LDL Cholesterol: 87 mg/dL (ref 0–99)
NonHDL: 108.61
Total CHOL/HDL Ratio: 3
Triglycerides: 106 mg/dL (ref 0.0–149.0)
VLDL: 21.2 mg/dL (ref 0.0–40.0)

## 2023-10-05 LAB — TSH: TSH: 2.11 u[IU]/mL (ref 0.35–5.50)

## 2023-10-05 LAB — VITAMIN B12: Vitamin B-12: 283 pg/mL (ref 211–911)

## 2023-10-05 LAB — T4, FREE: Free T4: 0.77 ng/dL (ref 0.60–1.60)

## 2023-10-05 NOTE — Progress Notes (Signed)
 Established Patient Office Visit   Subjective  Patient ID: Cheryl Stein, female    DOB: 1980/10/11  Age: 43 y.o. MRN: [id number]  Chief Complaint  Patient presents with   Annual Exam    Pt is a 43 year old female seen for CPE.  Patient doing well overall and is without complaint.  Recently returned from vacation at the beach with family.  Has noticed some difficulty losing weight.  Thinks amitriptyline  caused weight gain and started for IBS.  Trying to eat more protein.  Using exercise bike and walking regularly.  Plans to start more cardio.  Has a bump on L 2nd digit.  Painful at times, especially if hits against something.  Had tdap in 2016 after birth of her daughter.    Patient Active Problem List   Diagnosis Date Noted   Irritable bowel syndrome with constipation 05/09/2020   Pulmonary nodule 10/26/2019   Palpitations 08/27/2018   Encounter for trial of labor 09/18/2014   Past Medical History:  Diagnosis Date   Allergy    AMA (advanced maternal age) multigravida 35+    Anemia    periodic   Anxiety    Clotting disorder (HCC)    to prevent miscarage   Endometriosis    Family history of adverse reaction to anesthesia    mother has PONV   GERD (gastroesophageal reflux disease)    History of multiple miscarriages    Hx of varicella    IBS (irritable bowel syndrome)    Low blood pressure reading    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   Past Surgical History:  Procedure Laterality Date   CESAREAN SECTION  12/21/2010   Procedure: CESAREAN SECTION;  Surgeon: Alm JAYSON Cook, MD;  Location: [facility];  Service: Gynecology;  Laterality: N/A;  Primary Cesarean Section with birth of baby boy @ 38   CHOLECYSTECTOMY N/A 08/18/2016   Procedure: LAPAROSCOPIC CHOLECYSTECTOMY WITH INTRAOPERATIVE CHOLANGIOGRAM;  Surgeon: Ethyl Alm, MD;  Location: [facility];  Service: General;  Laterality: N/A;   DILATION AND EVACUATION     DILATION AND  EVACUATION N/A 02/15/2016   Procedure: DILATATION AND EVACUATION (D&E) 2ND TRIMESTER WITH GENETIC STUDIES;  Surgeon: Alm Cook, MD;  Location: [facility];  Service: Gynecology;  Laterality: N/A;  ok per [place] for this date and time   HYMENECTOMY     LAPAROSCOPY     WISDOM TOOTH EXTRACTION     Social History   Tobacco Use   Smoking status: Never   Smokeless tobacco: Never  Vaping Use   Vaping status: Never Used  Substance Use Topics   Alcohol use: Yes    Alcohol/week: 3.0 standard drinks of alcohol    Types: 3 Glasses of wine per week    Comment: Very low/moderate   Drug use: No   Family History  Problem Relation Age of Onset   Hypertension Mother    Arthritis Mother    Miscarriages / [place] Mother    Parkinson's disease Father    Hearing loss Father    Stroke Father    Kidney cancer Maternal Uncle    Diabetes Maternal Grandfather    Cancer Maternal Grandfather    Diabetes Paternal Grandmother    Birth defects Brother        anacephaly   Colon cancer Neg Hx    Esophageal cancer Neg Hx    Rectal cancer Neg Hx    Stomach cancer Neg Hx    Allergies  Allergen Reactions   Codeine Nausea And Vomiting   Latex Rash    ROS Negative unless stated above    Objective:     BP 112/74 (BP Location: Left Arm, Patient Position: Sitting, Cuff Size: Normal)   Pulse (!) 103   Temp 97.9 F (36.6 C) (Oral)   Ht 5' 6 (1.676 m)   Wt 167 lb 12.8 oz (76.1 kg)   LMP 09/21/2023   SpO2 99%   BMI 27.08 kg/m  BP Readings from Last 3 Encounters:  10/05/23 112/74  07/21/23 118/68  10/02/22 118/74   Wt Readings from Last 3 Encounters:  10/05/23 167 lb 12.8 oz (76.1 kg)  07/21/23 165 lb (74.8 kg)  10/02/22 159 lb (72.1 kg)      Physical Exam Constitutional:      Appearance: Normal appearance.  HENT:     Head: Normocephalic and atraumatic.     Right Ear: Tympanic membrane, ear canal and external ear normal.     Left Ear: Tympanic membrane, ear canal and external ear  normal.     Nose: Nose normal.     Mouth/Throat:     Mouth: Mucous membranes are moist.     Pharynx: No oropharyngeal exudate or posterior oropharyngeal erythema.  Eyes:     General: No scleral icterus.    Extraocular Movements: Extraocular movements intact.     Conjunctiva/sclera: Conjunctivae normal.     Pupils: Pupils are equal, round, and reactive to light.  Neck:     Thyroid : No thyromegaly.  Cardiovascular:     Rate and Rhythm: Normal rate and regular rhythm.     Pulses: Normal pulses.     Heart sounds: Normal heart sounds. No murmur heard.    No friction rub.  Pulmonary:     Effort: Pulmonary effort is normal.     Breath sounds: Normal breath sounds. No wheezing, rhonchi or rales.  Abdominal:     General: Bowel sounds are normal.     Palpations: Abdomen is soft.     Tenderness: There is no abdominal tenderness.  Musculoskeletal:        General: No deformity. Normal range of motion.  Lymphadenopathy:     Cervical: No cervical adenopathy.  Skin:    General: Skin is warm and dry.     Findings: No lesion.     Comments: Left second digit DIP joint with a 6 mm mobile round superficial cystic like lesion.  No erythema, induration, or drainage noted  Neurological:     General: No focal deficit present.     Mental Status: She is alert and oriented to person, place, and time.  Psychiatric:        Mood and Affect: Mood normal.        Thought Content: Thought content normal.        10/05/2023    8:40 AM 10/02/2022    9:22 AM 09/30/2021    8:11 AM  Depression screen PHQ 2/9  Decreased Interest 0 0 0  Down, Depressed, Hopeless 0 0 0  PHQ - 2 Score 0 0 0  Altered sleeping 0 0 0  Tired, decreased energy 0 0 0  Change in appetite 0 0 1  Feeling bad or failure about yourself  0 0 0  Trouble concentrating 0 0 0  Moving slowly or fidgety/restless 0 0 0  Suicidal thoughts 0 0 0  PHQ-9 Score 0 0 1  Difficult doing work/chores   Not difficult at all  10/05/2023    8:41 AM  10/02/2022    9:22 AM 09/17/2020    8:36 AM 09/08/2019    9:14 AM  GAD 7 : Generalized Anxiety Score  Nervous, Anxious, on Edge 1 1 0 0  Control/stop worrying 0 0 0 0  Worry too much - different things 0 0 0 0  Trouble relaxing 0 0 0 0  Restless 0 0 0 0  Easily annoyed or irritable 0 0 0 0  Afraid - awful might happen 0 0 0 0  Total GAD 7 Score 1 1 0 0  Anxiety Difficulty  Not difficult at all Not difficult at all Not difficult at all     No results found for any visits on 10/05/23.    Assessment & Plan:   Well adult exam -     CBC with Differential/Platelet; Future -     Comprehensive metabolic panel with GFR; Future -     Hemoglobin A1c; Future -     Lipid panel; Future -     T4, free; Future -     TSH; Future  Irritable bowel syndrome with constipation -     CBC with Differential/Platelet; Future -     Vitamin B12; Future -     VITAMIN D  25 Hydroxy (Vit-D Deficiency, Fractures); Future  Ganglion cyst of finger  Age appropriate health screenings advised.  Obtain labs.  Immunizations reviewed.  Tdap due next yr.  Mammogram done 02/05/23.  Colonoscopy done 08/22/20.  Continue current medications.  Likely ganglion cyst on left second digit.  Discussed removal options.  Patient wishes to monitor at this time.  No follow-ups on file.   Clotilda JONELLE Single, MD

## 2023-10-09 ENCOUNTER — Other Ambulatory Visit: Payer: Self-pay | Admitting: Physician Assistant

## 2023-10-09 DIAGNOSIS — R110 Nausea: Secondary | ICD-10-CM

## 2023-10-16 ENCOUNTER — Ambulatory Visit: Payer: Self-pay | Admitting: Family Medicine

## 2023-12-30 ENCOUNTER — Other Ambulatory Visit: Payer: Self-pay | Admitting: Obstetrics and Gynecology

## 2023-12-30 DIAGNOSIS — Z1231 Encounter for screening mammogram for malignant neoplasm of breast: Secondary | ICD-10-CM

## 2024-01-29 ENCOUNTER — Other Ambulatory Visit: Payer: Self-pay | Admitting: Physician Assistant

## 2024-01-29 DIAGNOSIS — R110 Nausea: Secondary | ICD-10-CM

## 2024-02-08 ENCOUNTER — Ambulatory Visit

## 2024-03-08 ENCOUNTER — Ambulatory Visit
Admission: RE | Admit: 2024-03-08 | Discharge: 2024-03-08 | Disposition: A | Source: Ambulatory Visit | Attending: Obstetrics and Gynecology | Admitting: Obstetrics and Gynecology

## 2024-03-08 DIAGNOSIS — Z1231 Encounter for screening mammogram for malignant neoplasm of breast: Secondary | ICD-10-CM
